# Patient Record
Sex: Male | Born: 1955 | Race: White | Hispanic: No | Smoking: Former smoker
Health system: Southern US, Community
[De-identification: ages and names within clinical notes are randomized; demographics above are authoritative.]

## PROBLEM LIST (undated history)

## (undated) DIAGNOSIS — Z8619 Personal history of other infectious and parasitic diseases: Secondary | ICD-10-CM

## (undated) DIAGNOSIS — Z8546 Personal history of malignant neoplasm of prostate: Secondary | ICD-10-CM

## (undated) DIAGNOSIS — E039 Hypothyroidism, unspecified: Secondary | ICD-10-CM

## (undated) DIAGNOSIS — C801 Malignant (primary) neoplasm, unspecified: Secondary | ICD-10-CM

## (undated) HISTORY — DX: Personal history of other infectious and parasitic diseases: Z86.19

## (undated) HISTORY — DX: Hypothyroidism, unspecified: E03.9

## (undated) HISTORY — DX: Personal history of malignant neoplasm of prostate: Z85.46

---

## 1999-03-08 HISTORY — PX: KNEE ARTHROSCOPY W/ ACL RECONSTRUCTION: SHX1858

## 2002-03-07 DIAGNOSIS — Z8546 Personal history of malignant neoplasm of prostate: Secondary | ICD-10-CM

## 2002-03-07 HISTORY — PX: PROSTATECTOMY: SHX69

## 2002-03-07 HISTORY — DX: Personal history of malignant neoplasm of prostate: Z85.46

## 2008-07-08 ENCOUNTER — Ambulatory Visit: Payer: Self-pay | Admitting: Gastroenterology

## 2010-03-07 DIAGNOSIS — E039 Hypothyroidism, unspecified: Secondary | ICD-10-CM

## 2010-03-07 HISTORY — DX: Hypothyroidism, unspecified: E03.9

## 2011-03-10 ENCOUNTER — Ambulatory Visit (INDEPENDENT_AMBULATORY_CARE_PROVIDER_SITE_OTHER): Payer: BC Managed Care – PPO | Admitting: Family Medicine

## 2011-03-10 ENCOUNTER — Encounter: Payer: Self-pay | Admitting: Family Medicine

## 2011-03-10 VITALS — BP 128/66 | HR 68 | Temp 98.4°F | Ht 74.0 in | Wt 250.0 lb

## 2011-03-10 DIAGNOSIS — E039 Hypothyroidism, unspecified: Secondary | ICD-10-CM

## 2011-03-10 DIAGNOSIS — R21 Rash and other nonspecific skin eruption: Secondary | ICD-10-CM

## 2011-03-10 DIAGNOSIS — Z8546 Personal history of malignant neoplasm of prostate: Secondary | ICD-10-CM | POA: Insufficient documentation

## 2011-03-10 DIAGNOSIS — K429 Umbilical hernia without obstruction or gangrene: Secondary | ICD-10-CM | POA: Insufficient documentation

## 2011-03-10 LAB — POCT SKIN KOH: Skin KOH, POC: NEGATIVE

## 2011-03-10 MED ORDER — DOXYCYCLINE HYCLATE 100 MG PO CAPS: 100.0000 mg | ORAL_CAPSULE | Freq: Two times a day (BID) | ORAL | Status: AC

## 2011-03-10 NOTE — Assessment & Plan Note (Signed)
Pt would like surgery eval deferred today. Will reassess when returns.

## 2011-03-10 NOTE — Assessment & Plan Note (Addendum)
KOH today negative. Anticipate component of xerosis, but concern for infection in addition so will treat with doxycycline.  Also recommended moisturizing of leg throughout the day. Superimposed pruritis, consider hydralazine if not improving. RTC 1 mo for f/u.

## 2011-03-10 NOTE — Progress Notes (Signed)
Subjective:    Patient ID: Philip Lindsey, male    DOB: 11-Mar-1955, 56 y.o.   MRN: [id number]  HPI CC: new pt establish  Prior saw Dr. Glenis Smoker.  Would like to establish as closer.  I will request records from Dr. Glenis Smoker - pt states handed in ROI up front  Would like leg checked.  Started with boil on left lateral side, has turned into more boils.  Started having boils on leg 11/2010, after mosquito bites.  Very itchy only at night time.  Has been present since October.  Has been self treating with alcohol and hydrogen peroxide.   No new lotions, detergents, soaps, shampoos, foods.  H/o prostate cancer s/p radical prostatectomy, PSA 0 per pt last checked 12/2010.  Has umbilical hernia.  Has not seen surgery about this.  Preventative: Had CPE 11/2010.  Had [place] of aorta negative for aneurysms and blood work colonoscopy - WNL 2010, rpt 10 yrs, will await records. Prostate - s/p cancer, radical prostatectomy 2004.  PSA with prior PCP. Flu shot - declines.  "i get sick" Tetanus - unsure when, will await records.  Caffeine: 1 coffee/day, 2-3 tea/soda/day Lives with fiancee, no pets.  2 grown children, one local Occupation: Investment banker, corporate Edu: [employer]yrs college Activity: bowling Diet: fair - little water, fruits/vegetables daily, red meat 2-3x/wk, fish 1-2x/wk  Medications and allergies reviewed and updated in chart.  Past histories reviewed and updated if relevant as below. There is no problem list on file for this patient.  Past Medical History  Diagnosis Date  . Prostate cancer 2004    prostatectomy 2004  . History of chicken pox   . Hypothyroid 2012    on synthroid   Past Surgical History  Procedure Date  . Knee arthroscopy w/ acl reconstruction 2001  . Prostatectomy 2004   History  Substance Use Topics  . Smoking status: Former Smoker -- 0.5 packs/day for 10 years    Quit date: 03/07/2005  . Smokeless tobacco: Never Used  . Alcohol Use: No   Family History  Problem  Relation Age of Onset  . Diabetes Mother   . Coronary artery disease Neg Hx   . Stroke Neg Hx   . Hypertension Neg Hx   . Hyperlipidemia Neg Hx   . Cancer Neg Hx    No Known Allergies No current outpatient prescriptions on file prior to visit.   Review of Systems  Constitutional: Negative for fever, chills, activity change, appetite change, fatigue and unexpected weight change.  HENT: Negative for hearing loss and neck pain.   Eyes: Negative for visual disturbance.  Respiratory: Negative for cough, chest tightness, shortness of breath and wheezing.   Cardiovascular: Negative for chest pain, palpitations and leg swelling.  Gastrointestinal: Negative for nausea, vomiting, abdominal pain, diarrhea, constipation, blood in stool and abdominal distention.  Genitourinary: Negative for hematuria and difficulty urinating.  Musculoskeletal: Negative for myalgias and arthralgias.  Skin: Negative for rash.  Neurological: Negative for dizziness, seizures, syncope and headaches.  Hematological: Does not bruise/bleed easily.  Psychiatric/Behavioral: Negative for dysphoric mood. The patient is not nervous/anxious.        Objective:   Physical Exam  Nursing note and vitals reviewed. Constitutional: She is oriented to person, place, and time. She appears well-developed and well-nourished. No distress.  HENT:  Head: Normocephalic and atraumatic.  Right Ear: Hearing, tympanic membrane, external ear and ear canal normal.  Left Ear: Hearing, tympanic membrane, external ear and ear canal normal.  Nose: Nose normal. No  mucosal edema. Right sinus exhibits no maxillary sinus tenderness and no frontal sinus tenderness. Left sinus exhibits no maxillary sinus tenderness and no frontal sinus tenderness.  Mouth/Throat: Uvula is midline, oropharynx is clear and moist and mucous membranes are normal. No oropharyngeal exudate, posterior oropharyngeal edema, posterior oropharyngeal erythema or tonsillar abscesses.    Eyes: Conjunctivae and EOM are normal. Pupils are equal, round, and reactive to light. No scleral icterus.       Xanthelasma of lower eye lids  Neck: Normal range of motion. Neck supple. No thyromegaly present.  Cardiovascular: Normal rate, regular rhythm, normal heart sounds and intact distal pulses.   No murmur heard. Pulses:      Radial pulses are 2+ on the right side, and 2+ on the left side.  Pulmonary/Chest: Effort normal and breath sounds normal. No respiratory distress. She has no wheezes. She has no rales.  Abdominal: Soft. Bowel sounds are normal. She exhibits no distension and no mass. There is no tenderness. There is no rebound and no guarding. A hernia (~2.5cm umbilical hernia, reducible) is present.  Musculoskeletal: Normal range of motion.  Lymphadenopathy:    She has no cervical adenopathy.  Neurological: She is alert and oriented to person, place, and time.       CN grossly intact, station and gait intact  Skin: Skin is warm and dry. Rash noted.       Bilateral legs L>R with erythematous scaly rash, left lateral leg with scab surrounded by erythema, + dry skin, superior and posterior with newer small 1cm boil Dry skin external ears, scaling of scalp  Psychiatric: She has a normal mood and affect. Her behavior is normal. Judgment and thought content normal.     Assessment & Plan:

## 2011-03-10 NOTE — Patient Instructions (Addendum)
For legs - moisturizing cream like aveeno or eucerin brands.  Doxycycline twice daily for 10 days to cover any infection component.  Return in 1 month for follow up. We will call you if scrapings change plan. I will await records from Dr. Glenis Smoker. Good to meet you today.  Call us with questions.

## 2011-03-10 NOTE — Assessment & Plan Note (Signed)
Await records from prior PCP. 

## 2011-03-10 NOTE — Assessment & Plan Note (Signed)
needs yearly PSA to monitor.

## 2011-04-11 ENCOUNTER — Ambulatory Visit (INDEPENDENT_AMBULATORY_CARE_PROVIDER_SITE_OTHER): Payer: BC Managed Care – PPO | Admitting: Family Medicine

## 2011-04-11 ENCOUNTER — Encounter: Payer: Self-pay | Admitting: Family Medicine

## 2011-04-11 VITALS — BP 136/74 | HR 72 | Temp 99.1°F | Wt 252.0 lb

## 2011-04-11 DIAGNOSIS — R21 Rash and other nonspecific skin eruption: Secondary | ICD-10-CM

## 2011-04-11 MED ORDER — TRIAMCINOLONE ACETONIDE 0.1 % EX CREA: TOPICAL_CREAM | Freq: Two times a day (BID) | CUTANEOUS | Status: AC

## 2011-04-11 NOTE — Assessment & Plan Note (Addendum)
Dry skin rash - anticipate xerosis.  ddx includes psoriatic vs fungal. Last visit KOH negative. Stop rubbing EtOH, rec start TCI cream as well as moisturizer.  Discussed if worsening on steroid, stop and notify me. Did improve after doxy, no evidence of continued infection. rec take benadryl nightly for 1 wk to help decrease scratching at night.

## 2011-04-11 NOTE — Patient Instructions (Addendum)
I think you have component of dry skin dermatitis or xerosis. We will start you on mild steroid cream to use for legs.  Use this then use aveeno moisturizer. Stop alcohol rub. Let me know if not improving.  Let me know if itching getting worse. Benadryl at night for itching. We will call Dr. Martie Round office to check on records.

## 2011-04-11 NOTE — Progress Notes (Signed)
  Subjective:    Patient ID: Philip Lindsey, male    DOB: 1955/05/27, 56 y.o.   MRN: [id number]  HPI CC: f/u rash  Seen here 03/2011 as new pt, had rash on legs, boils.  Prior visit: KOH negative.  Anticipated component of xerosis, but concern for infection in addition so treated with doxycycline.  Also recommended moisturizing of leg throughout the day.  rec rtc 1 mo.  Overall better.  Still itching only at night.  Pruritis not bothering him that much.  Review of Systems Per HPI    Objective:   Physical Exam  Nursing note and vitals reviewed. Constitutional: He appears well-developed and well-nourished. No distress.  Musculoskeletal:       Legs: Skin: Skin is warm and dry. Rash noted. No erythema.       Bilateral lower legs L>R with several 2-3 cm erythematous scaly annular rash, old scab healing.   + excoriations throughout lower legs       Assessment & Plan:

## 2014-05-06 HISTORY — PX: COLONOSCOPY: SHX174

## 2014-05-12 LAB — HM COLONOSCOPY: HM COLON: NORMAL

## 2014-06-20 ENCOUNTER — Encounter: Payer: Self-pay | Admitting: Family Medicine

## 2015-04-13 ENCOUNTER — Emergency Department: Payer: BLUE CROSS/BLUE SHIELD

## 2015-04-13 ENCOUNTER — Inpatient Hospital Stay
Admission: EM | Admit: 2015-04-13 | Discharge: 2015-04-22 | DRG: 208 | Disposition: A | Discharge status: Other Acute Inpatient Hospital | Payer: BLUE CROSS/BLUE SHIELD | Attending: Internal Medicine | Admitting: Internal Medicine

## 2015-04-13 ENCOUNTER — Encounter: Payer: Self-pay | Admitting: Emergency Medicine

## 2015-04-13 DIAGNOSIS — T8579XA Infection and inflammatory reaction due to other internal prosthetic devices, implants and grafts, initial encounter: Secondary | ICD-10-CM

## 2015-04-13 DIAGNOSIS — Z8546 Personal history of malignant neoplasm of prostate: Secondary | ICD-10-CM

## 2015-04-13 DIAGNOSIS — E860 Dehydration: Secondary | ICD-10-CM | POA: Diagnosis present

## 2015-04-13 DIAGNOSIS — I509 Heart failure, unspecified: Secondary | ICD-10-CM

## 2015-04-13 DIAGNOSIS — T380X5A Adverse effect of glucocorticoids and synthetic analogues, initial encounter: Secondary | ICD-10-CM | POA: Diagnosis not present

## 2015-04-13 DIAGNOSIS — R05 Cough: Secondary | ICD-10-CM

## 2015-04-13 DIAGNOSIS — E876 Hypokalemia: Secondary | ICD-10-CM | POA: Diagnosis present

## 2015-04-13 DIAGNOSIS — J9601 Acute respiratory failure with hypoxia: Secondary | ICD-10-CM | POA: Diagnosis present

## 2015-04-13 DIAGNOSIS — J849 Interstitial pulmonary disease, unspecified: Secondary | ICD-10-CM | POA: Diagnosis present

## 2015-04-13 DIAGNOSIS — E039 Hypothyroidism, unspecified: Secondary | ICD-10-CM | POA: Diagnosis present

## 2015-04-13 DIAGNOSIS — I248 Other forms of acute ischemic heart disease: Secondary | ICD-10-CM | POA: Diagnosis present

## 2015-04-13 DIAGNOSIS — R739 Hyperglycemia, unspecified: Secondary | ICD-10-CM | POA: Diagnosis not present

## 2015-04-13 DIAGNOSIS — Z01818 Encounter for other preprocedural examination: Secondary | ICD-10-CM

## 2015-04-13 DIAGNOSIS — R509 Fever, unspecified: Secondary | ICD-10-CM

## 2015-04-13 DIAGNOSIS — A419 Sepsis, unspecified organism: Secondary | ICD-10-CM | POA: Diagnosis present

## 2015-04-13 DIAGNOSIS — R0600 Dyspnea, unspecified: Secondary | ICD-10-CM

## 2015-04-13 DIAGNOSIS — R059 Cough, unspecified: Secondary | ICD-10-CM

## 2015-04-13 DIAGNOSIS — K5900 Constipation, unspecified: Secondary | ICD-10-CM

## 2015-04-13 DIAGNOSIS — J9690 Respiratory failure, unspecified, unspecified whether with hypoxia or hypercapnia: Secondary | ICD-10-CM

## 2015-04-13 DIAGNOSIS — J189 Pneumonia, unspecified organism: Principal | ICD-10-CM

## 2015-04-13 DIAGNOSIS — Z452 Encounter for adjustment and management of vascular access device: Secondary | ICD-10-CM

## 2015-04-13 DIAGNOSIS — Z87891 Personal history of nicotine dependence: Secondary | ICD-10-CM

## 2015-04-13 DIAGNOSIS — Y92230 Patient room in hospital as the place of occurrence of the external cause: Secondary | ICD-10-CM | POA: Diagnosis not present

## 2015-04-13 DIAGNOSIS — Z833 Family history of diabetes mellitus: Secondary | ICD-10-CM

## 2015-04-13 HISTORY — DX: Malignant (primary) neoplasm, unspecified: C80.1

## 2015-04-13 LAB — CBC WITH DIFFERENTIAL/PLATELET
BASOS ABS: 0.1 10*3/uL (ref 0–0.1)
Basophils Relative: 1 %
Eosinophils Absolute: 0 10*3/uL (ref 0–0.7)
Eosinophils Relative: 0 %
HEMATOCRIT: 40.3 % (ref 40.0–52.0)
HEMOGLOBIN: 13.6 g/dL (ref 13.0–18.0)
LYMPHS ABS: 0.4 10*3/uL — AB (ref 1.0–3.6)
LYMPHS PCT: 4 %
MCH: 28.1 pg (ref 26.0–34.0)
MCHC: 33.7 g/dL (ref 32.0–36.0)
MCV: 83.3 fL (ref 80.0–100.0)
Monocytes Absolute: 1 10*3/uL (ref 0.2–1.0)
Monocytes Relative: 11 %
NEUTROS ABS: 7.9 10*3/uL — AB (ref 1.4–6.5)
NEUTROS PCT: 84 %
Platelets: 217 10*3/uL (ref 150–440)
RBC: 4.84 MIL/uL (ref 4.40–5.90)
RDW: 13.8 % (ref 11.5–14.5)
WBC: 9.4 10*3/uL (ref 3.8–10.6)

## 2015-04-13 LAB — PROTIME-INR
INR: 1.14
PROTHROMBIN TIME: 14.8 s (ref 11.4–15.0)

## 2015-04-13 LAB — COMPREHENSIVE METABOLIC PANEL
ALBUMIN: 4 g/dL (ref 3.5–5.0)
ALK PHOS: 85 U/L (ref 38–126)
ALT: 49 U/L (ref 17–63)
ANION GAP: 8 (ref 5–15)
AST: 50 U/L — ABNORMAL HIGH (ref 15–41)
BUN: 14 mg/dL (ref 6–20)
CALCIUM: 8.5 mg/dL — AB (ref 8.9–10.3)
CHLORIDE: 102 mmol/L (ref 101–111)
CO2: 27 mmol/L (ref 22–32)
Creatinine, Ser: 0.88 mg/dL (ref 0.61–1.24)
GFR calc Af Amer: >60 mL/min (ref >60)
GFR calc non Af Amer: >60 mL/min (ref >60)
GLUCOSE: 206 mg/dL — AB (ref 65–99)
POTASSIUM: 3.9 mmol/L (ref 3.5–5.1)
SODIUM: 137 mmol/L (ref 135–145)
Total Bilirubin: 0.7 mg/dL (ref 0.3–1.2)
Total Protein: 7.2 g/dL (ref 6.5–8.1)

## 2015-04-13 LAB — RAPID INFLUENZA A&B ANTIGENS (ARMC ONLY)
INFLUENZA A (ARMC): NOT DETECTED
INFLUENZA B (ARMC): NOT DETECTED

## 2015-04-13 LAB — TROPONIN I: TROPONIN I: 0.04 ng/mL — AB (ref <0.031)

## 2015-04-13 LAB — LACTIC ACID, PLASMA: LACTIC ACID, VENOUS: 2 mmol/L (ref 0.5–2.0)

## 2015-04-13 LAB — BRAIN NATRIURETIC PEPTIDE: B Natriuretic Peptide: 25 pg/mL (ref 0.0–100.0)

## 2015-04-13 LAB — APTT: APTT: 30 s (ref 24–36)

## 2015-04-13 MED ORDER — SODIUM CHLORIDE 0.9 % IV BOLUS (SEPSIS)
1000.0000 mL | Freq: Once | INTRAVENOUS | Status: AC
  Administered 2015-04-13: 1000 mL via INTRAVENOUS

## 2015-04-13 MED ORDER — PIPERACILLIN-TAZOBACTAM 3.375 G IVPB
3.3750 g | Freq: Once | INTRAVENOUS | Status: DC
  Filled 2015-04-13 (×2): qty 50

## 2015-04-13 MED ORDER — SODIUM CHLORIDE 0.9 % IV BOLUS (SEPSIS)
2390.0000 mL | Freq: Once | INTRAVENOUS | Status: AC
  Administered 2015-04-14: 2390 mL via INTRAVENOUS

## 2015-04-13 MED ORDER — ASPIRIN 81 MG PO CHEW
324.0000 mg | CHEWABLE_TABLET | Freq: Once | ORAL | Status: AC
  Administered 2015-04-14: 324 mg via ORAL
  Filled 2015-04-13: qty 4

## 2015-04-13 MED ORDER — VANCOMYCIN HCL IN DEXTROSE 1-5 GM/200ML-% IV SOLN
1000.0000 mg | Freq: Once | INTRAVENOUS | Status: AC
  Administered 2015-04-13: 1000 mg via INTRAVENOUS
  Filled 2015-04-13: qty 200

## 2015-04-13 MED ORDER — ACETAMINOPHEN 500 MG PO TABS
1000.0000 mg | ORAL_TABLET | Freq: Once | ORAL | Status: AC
  Administered 2015-04-13: 1000 mg via ORAL
  Filled 2015-04-13: qty 2

## 2015-04-13 MED ORDER — DEXTROSE 5 % IV SOLN: 500.0000 mg | Freq: Once | INTRAVENOUS | Status: DC

## 2015-04-13 NOTE — ED Notes (Signed)
Pt ambulatory to triage c/o cough (producing brown mucus) and fever, SOB worse with exertion.  Pt got "Z-pack" from walkin clinic in Dec and felt some relief but s/sx returned worse and for the last month.    Pt denies V/D/dizziness or pain.  Denies hx of medical conditions.

## 2015-04-13 NOTE — ED Provider Notes (Signed)
[facility] Emergency Department Provider Note  ____________________________________________  Time seen: Approximately 10:45 PM  I have reviewed the triage vital signs and the nursing notes.   HISTORY  Chief Complaint Cough and Fever    HPI EVAGELOS CERNEY is a 60 y.o. male with history of thyroid disease, remote history of prostate cancer who presents for evaluation of 3 days worsening productive cough and fever as well as shortness of breath, gradual onset, constant since onset, currently severe, no modifying factors. He does have chest pain when he coughs. No abdominal pain, vomiting or diarrhea. No dysuria.   Past Medical History  Diagnosis Date  . History of prostate cancer 2004    prostatectomy 2004  . History of chicken pox   . Hypothyroid 2012    on synthroid    Patient Active Problem List   Diagnosis Date Noted  . Umbilical hernia [id number]  . Skin rash 03/10/2011  . History of prostate cancer   . Hypothyroid     Past Surgical History  Procedure Laterality Date  . Knee arthroscopy w/ acl reconstruction  2001  . Prostatectomy  2004  . Colonoscopy  05/2014    normal, rpt 43yrs due to fam hx Oletta Lamas)    Current Outpatient Rx  Name  Route  Sig  Dispense  Refill  . levothyroxine (SYNTHROID, LEVOTHROID) 50 MCG tablet   Oral   Take 50 mcg by mouth daily. Reported on 04/13/2015           Allergies Review of patient's allergies indicates no known allergies.  Family History  Problem Relation Age of Onset  . Diabetes Mother   . Coronary artery disease Neg Hx   . Stroke Neg Hx   . Hypertension Neg Hx   . Hyperlipidemia Neg Hx   . Cancer Neg Hx     Social History Social History  Substance Use Topics  . Smoking status: Former Smoker -- 0.50 packs/day for 10 years    Quit date: 03/07/2005  . Smokeless tobacco: Never Used  . Alcohol Use: No    Review of Systems Constitutional: + fever/chills Eyes: No visual changes. ENT: No  sore throat. Cardiovascular: + chest pain. Respiratory: +shortness of breath. Gastrointestinal: No abdominal pain.  No nausea, no vomiting.  No diarrhea.  No constipation. Genitourinary: Negative for dysuria. Musculoskeletal: Negative for back pain. Skin: Negative for rash. Neurological: Negative for headaches, focal weakness or numbness.  10-point ROS otherwise negative.  ____________________________________________   PHYSICAL EXAM:  VITAL SIGNS: ED Triage Vitals  Enc Vitals Group     BP 04/13/15 2207 160/64 mmHg     Pulse Rate 04/13/15 2207 128     Resp 04/13/15 2207 18     Temp 04/13/15 2207 103.1 F (39.5 C)     Temp Source 04/13/15 2207 Oral     SpO2 04/13/15 2207 94 %     Weight 04/13/15 2207 250 lb (113.399 kg)     Height 04/13/15 2207 6\' 2"  (1.88 m)     Head Cir --      Peak Flow --      Pain Score --      Pain Loc --      Pain Edu? --      Excl. in [place]? --     Constitutional: Alert and oriented. In no acute distress. Eyes: Conjunctivae are normal. PERRL. EOMI. Head: Atraumatic. Nose: + congestion/rhinnorhea. Mouth/Throat: Mucous membranes are moist.  Oropharynx non-erythematous. Neck: No stridor. Supple without meningismus.  Cardiovascular: Tachycardic rate, regular rhythm. Grossly normal heart sounds.  Good peripheral circulation. Respiratory: Moderate tachypnea. Diminished breath sounds throughout. Gastrointestinal: Soft and nontender. No distention.  No CVA tenderness. Genitourinary: deferred Musculoskeletal: No lower extremity tenderness nor edema.  No joint effusions. Neurologic:  Normal speech and language. No gross focal neurologic deficits are appreciated. No gait instability. Skin:  Skin is warm, dry and intact. No rash noted. Psychiatric: Mood and affect are normal. Speech and behavior are normal.  ____________________________________________   LABS (all labs ordered are listed, but only abnormal results are displayed)  Labs Reviewed   COMPREHENSIVE METABOLIC PANEL - Abnormal; Notable for the following:    Glucose, Bld 206 (*)    Calcium 8.5 (*)    AST 50 (*)    All other components within normal limits  TROPONIN I - Abnormal; Notable for the following:    Troponin I 0.04 (*)    All other components within normal limits  CBC WITH DIFFERENTIAL/PLATELET - Abnormal; Notable for the following:    Neutro Abs 7.9 (*)    Lymphs Abs 0.4 (*)    All other components within normal limits  RAPID INFLUENZA A&B ANTIGENS ([facility] ONLY)  CULTURE, BLOOD (ROUTINE X 2)  CULTURE, BLOOD (ROUTINE X 2)  URINE CULTURE  LACTIC ACID, PLASMA  BRAIN NATRIURETIC PEPTIDE  APTT  PROTIME-INR  LACTIC ACID, PLASMA  URINALYSIS COMPLETEWITH MICROSCOPIC ([facility] ONLY)  TYPE AND SCREEN  ABO/RH   ____________________________________________  EKG  ED ECG REPORT I, Joanne Gavel, the attending physician, personally viewed and interpreted this ECG.   Date: 04/13/2015  EKG Time: 22:45  Rate: 122  Rhythm: sinus tachycardia  Axis: normal  Intervals: QTc mildly prolonged at 518 ms.  ST&T Change: No acute ST elevation. Minimal ST depression in V4, V5.  ____________________________________________  RADIOLOGY  CXR IMPRESSION: Vascular congestion noted. Mildly increased lung markings may reflect atelectasis or possibly mild infection, given the patient's symptoms. ____________________________________________   PROCEDURES  Procedure(s) performed: None  Critical Care performed: Yes, see critical care note(s). Total critical care time spent 30 minutes.  ____________________________________________   INITIAL IMPRESSION / ASSESSMENT AND PLAN / ED COURSE  Pertinent labs & imaging results that were available during my care of the patient were reviewed by me and considered in my medical decision making (see chart for details).  TREVINO SHAKUR is a 60 y.o. male with history of thyroid disease, remote history of prostate cancer who presents for  evaluation of 3 days worsening productive cough and fever as well as shortness of breath. On arrival to the emergency department he is meeting 3 out of 4 Sirs criteria for fever, tachycardia and tachypnea. He has diminished breath sounds throughout all lung fields. We'll give IV fluids, IV vancomycin and Zosyn, obtain chest x-ray, urinalysis, screening labs and anticipate admission.  ----------------------------------------- 11:54 PM on 04/13/2015 ----------------------------------------- Chest x-ray concerning for infection. Suspect sepsis secondary to pneumonia. Azithromycin added for atypical coverage for community acquired pneumonia. Flu negative. CBC and CMP generally unremarkable. Troponin mildly elevated 0.04, suspect demand ischemia in the setting of tachycardia which is improving with the above treatments. Aspirin ordered. Case discussed with hospitalist, Dr. Orvilla Cornwall, for admission.  ____________________________________________   FINAL CLINICAL IMPRESSION(S) / ED DIAGNOSES  Final diagnoses:  Fever, unspecified fever cause  Cough  Sepsis, due to unspecified organism [facility])  Community acquired pneumonia      Joanne Gavel, MD 04/14/15 [phone]

## 2015-04-13 NOTE — ED Notes (Signed)
Patient transported to X-ray via stretcher 

## 2015-04-14 ENCOUNTER — Encounter: Payer: Self-pay | Admitting: Internal Medicine

## 2015-04-14 ENCOUNTER — Inpatient Hospital Stay
Admit: 2015-04-14 | Discharge: 2015-04-14 | Disposition: A | Discharge status: Home / Self Care | Payer: BLUE CROSS/BLUE SHIELD | Attending: Internal Medicine | Admitting: Internal Medicine

## 2015-04-14 DIAGNOSIS — Y92230 Patient room in hospital as the place of occurrence of the external cause: Secondary | ICD-10-CM | POA: Diagnosis not present

## 2015-04-14 DIAGNOSIS — J9621 Acute and chronic respiratory failure with hypoxia: Secondary | ICD-10-CM | POA: Diagnosis not present

## 2015-04-14 DIAGNOSIS — E876 Hypokalemia: Secondary | ICD-10-CM | POA: Diagnosis present

## 2015-04-14 DIAGNOSIS — J849 Interstitial pulmonary disease, unspecified: Secondary | ICD-10-CM | POA: Diagnosis present

## 2015-04-14 DIAGNOSIS — E860 Dehydration: Secondary | ICD-10-CM | POA: Diagnosis present

## 2015-04-14 DIAGNOSIS — R739 Hyperglycemia, unspecified: Secondary | ICD-10-CM | POA: Diagnosis not present

## 2015-04-14 DIAGNOSIS — A419 Sepsis, unspecified organism: Secondary | ICD-10-CM | POA: Diagnosis present

## 2015-04-14 DIAGNOSIS — I248 Other forms of acute ischemic heart disease: Secondary | ICD-10-CM | POA: Diagnosis present

## 2015-04-14 DIAGNOSIS — J189 Pneumonia, unspecified organism: Secondary | ICD-10-CM | POA: Diagnosis not present

## 2015-04-14 DIAGNOSIS — Z833 Family history of diabetes mellitus: Secondary | ICD-10-CM | POA: Diagnosis not present

## 2015-04-14 DIAGNOSIS — Z87891 Personal history of nicotine dependence: Secondary | ICD-10-CM | POA: Diagnosis not present

## 2015-04-14 DIAGNOSIS — T380X5A Adverse effect of glucocorticoids and synthetic analogues, initial encounter: Secondary | ICD-10-CM | POA: Diagnosis not present

## 2015-04-14 DIAGNOSIS — E039 Hypothyroidism, unspecified: Secondary | ICD-10-CM | POA: Diagnosis present

## 2015-04-14 DIAGNOSIS — J9601 Acute respiratory failure with hypoxia: Secondary | ICD-10-CM | POA: Diagnosis not present

## 2015-04-14 DIAGNOSIS — Z8546 Personal history of malignant neoplasm of prostate: Secondary | ICD-10-CM | POA: Diagnosis not present

## 2015-04-14 LAB — URINALYSIS COMPLETE WITH MICROSCOPIC (ARMC ONLY)
BILIRUBIN URINE: NEGATIVE
Bacteria, UA: NONE SEEN
GLUCOSE, UA: NEGATIVE mg/dL
HGB URINE DIPSTICK: NEGATIVE
LEUKOCYTES UA: NEGATIVE
NITRITE: NEGATIVE
Protein, ur: 30 mg/dL — AB
SPECIFIC GRAVITY, URINE: 1.027 (ref 1.005–1.030)
pH: 6 (ref 5.0–8.0)

## 2015-04-14 LAB — COMPREHENSIVE METABOLIC PANEL
ALT: 35 U/L (ref 17–63)
ANION GAP: 4 — AB (ref 5–15)
AST: 41 U/L (ref 15–41)
Albumin: 2.9 g/dL — ABNORMAL LOW (ref 3.5–5.0)
Alkaline Phosphatase: 58 U/L (ref 38–126)
BUN: 11 mg/dL (ref 6–20)
CHLORIDE: 114 mmol/L — AB (ref 101–111)
CO2: 25 mmol/L (ref 22–32)
CREATININE: 0.8 mg/dL (ref 0.61–1.24)
Calcium: 6.4 mg/dL — CL (ref 8.9–10.3)
Glucose, Bld: 140 mg/dL — ABNORMAL HIGH (ref 65–99)
Potassium: 3.2 mmol/L — ABNORMAL LOW (ref 3.5–5.1)
SODIUM: 143 mmol/L (ref 135–145)
Total Bilirubin: 0.5 mg/dL (ref 0.3–1.2)
Total Protein: 5.3 g/dL — ABNORMAL LOW (ref 6.5–8.1)

## 2015-04-14 LAB — CBC WITH DIFFERENTIAL/PLATELET
BASOS ABS: 0 10*3/uL (ref 0–0.1)
BASOS PCT: 0 %
Eosinophils Absolute: 0.1 10*3/uL (ref 0–0.7)
Eosinophils Relative: 1 %
HEMATOCRIT: 33.5 % — AB (ref 40.0–52.0)
HEMOGLOBIN: 11.1 g/dL — AB (ref 13.0–18.0)
LYMPHS PCT: 10 %
Lymphs Abs: 0.5 10*3/uL — ABNORMAL LOW (ref 1.0–3.6)
MCH: 28.5 pg (ref 26.0–34.0)
MCHC: 33.1 g/dL (ref 32.0–36.0)
MCV: 86.1 fL (ref 80.0–100.0)
Monocytes Absolute: 0.7 10*3/uL (ref 0.2–1.0)
Monocytes Relative: 13 %
NEUTROS ABS: 4 10*3/uL (ref 1.4–6.5)
NEUTROS PCT: 76 %
PLATELETS: 176 10*3/uL (ref 150–440)
RBC: 3.89 MIL/uL — ABNORMAL LOW (ref 4.40–5.90)
RDW: 14 % (ref 11.5–14.5)
WBC: 5.2 10*3/uL (ref 3.8–10.6)

## 2015-04-14 LAB — PROCALCITONIN: PROCALCITONIN: 0.21 ng/mL

## 2015-04-14 LAB — PROTIME-INR
INR: 1.4
PROTHROMBIN TIME: 17.3 s — AB (ref 11.4–15.0)

## 2015-04-14 LAB — PHOSPHORUS: Phosphorus: 2.9 mg/dL (ref 2.5–4.6)

## 2015-04-14 LAB — TROPONIN I
TROPONIN I: 0.03 ng/mL (ref <0.031)
Troponin I: 0.04 ng/mL — ABNORMAL HIGH (ref <0.031)
Troponin I: 0.07 ng/mL — ABNORMAL HIGH (ref <0.031)
Troponin I: 0.05 ng/mL — ABNORMAL HIGH (ref <0.031)

## 2015-04-14 LAB — TYPE AND SCREEN
ABO/RH(D): O POS
Antibody Screen: NEGATIVE

## 2015-04-14 LAB — STREP PNEUMONIAE URINARY ANTIGEN: Strep Pneumo Urinary Antigen: NEGATIVE

## 2015-04-14 LAB — MAGNESIUM: Magnesium: 1.5 mg/dL — ABNORMAL LOW (ref 1.7–2.4)

## 2015-04-14 LAB — LACTIC ACID, PLASMA: LACTIC ACID, VENOUS: 1.9 mmol/L (ref 0.5–2.0)

## 2015-04-14 LAB — APTT: APTT: 33 s (ref 24–36)

## 2015-04-14 LAB — ABO/RH: ABO/RH(D): O POS

## 2015-04-14 MED ORDER — ENOXAPARIN SODIUM 40 MG/0.4ML ~~LOC~~ SOLN
40.0000 mg | SUBCUTANEOUS | Status: DC
  Administered 2015-04-14 – 2015-04-15 (×2): 40 mg via SUBCUTANEOUS
  Filled 2015-04-14 (×2): qty 0.4

## 2015-04-14 MED ORDER — IBUPROFEN 400 MG PO TABS
400.0000 mg | ORAL_TABLET | Freq: Four times a day (QID) | ORAL | Status: DC | PRN
  Administered 2015-04-14 – 2015-04-16 (×4): 400 mg via ORAL
  Filled 2015-04-14 (×4): qty 1

## 2015-04-14 MED ORDER — SODIUM CHLORIDE 0.9 % IV SOLN
INTRAVENOUS | Status: DC
  Administered 2015-04-14 – 2015-04-16 (×4): via INTRAVENOUS

## 2015-04-14 MED ORDER — SODIUM CHLORIDE 0.9 % IV BOLUS (SEPSIS): 500.0000 mL | INTRAVENOUS | Status: DC

## 2015-04-14 MED ORDER — PIPERACILLIN-TAZOBACTAM 3.375 G IVPB 30 MIN
3.3750 g | INTRAVENOUS | Status: AC
  Administered 2015-04-14: 3.375 g via INTRAVENOUS

## 2015-04-14 MED ORDER — DEXTROSE 5 % IV SOLN
500.0000 mg | Freq: Once | INTRAVENOUS | Status: AC
  Administered 2015-04-14: 500 mg via INTRAVENOUS
  Filled 2015-04-14: qty 500

## 2015-04-14 MED ORDER — LEVOTHYROXINE SODIUM 25 MCG PO TABS
50.0000 ug | ORAL_TABLET | Freq: Every day | ORAL | Status: DC
  Administered 2015-04-14 – 2015-04-22 (×9): 50 ug via ORAL
  Filled 2015-04-14: qty 1
  Filled 2015-04-14 (×7): qty 2
  Filled 2015-04-14: qty 1

## 2015-04-14 MED ORDER — ACETAMINOPHEN 650 MG RE SUPP: 650.0000 mg | Freq: Four times a day (QID) | RECTAL | Status: DC | PRN

## 2015-04-14 MED ORDER — ENOXAPARIN SODIUM 40 MG/0.4ML ~~LOC~~ SOLN: 40.0000 mg | SUBCUTANEOUS | Status: DC

## 2015-04-14 MED ORDER — ONDANSETRON HCL 4 MG PO TABS: 4.0000 mg | ORAL_TABLET | Freq: Four times a day (QID) | ORAL | Status: DC | PRN

## 2015-04-14 MED ORDER — SODIUM CHLORIDE 0.9 % IV SOLN
1.0000 g | Freq: Once | INTRAVENOUS | Status: AC
  Administered 2015-04-14: 1 g via INTRAVENOUS
  Filled 2015-04-14 (×2): qty 10

## 2015-04-14 MED ORDER — MAGNESIUM SULFATE 2 GM/50ML IV SOLN
2.0000 g | Freq: Once | INTRAVENOUS | Status: AC
  Administered 2015-04-14: 2 g via INTRAVENOUS
  Filled 2015-04-14: qty 50

## 2015-04-14 MED ORDER — SODIUM CHLORIDE 0.9% FLUSH
3.0000 mL | Freq: Two times a day (BID) | INTRAVENOUS | Status: DC
  Administered 2015-04-15 – 2015-04-19 (×9): 3 mL via INTRAVENOUS
  Administered 2015-04-19: 10 mL via INTRAVENOUS
  Administered 2015-04-20 – 2015-04-22 (×4): 3 mL via INTRAVENOUS

## 2015-04-14 MED ORDER — DEXTROSE 5 % IV SOLN
1.0000 g | INTRAVENOUS | Status: DC
  Administered 2015-04-15: 1 g via INTRAVENOUS
  Filled 2015-04-14 (×2): qty 10

## 2015-04-14 MED ORDER — PANTOPRAZOLE SODIUM 40 MG PO TBEC
40.0000 mg | DELAYED_RELEASE_TABLET | Freq: Every day | ORAL | Status: DC
  Administered 2015-04-14 – 2015-04-15 (×2): 40 mg via ORAL
  Filled 2015-04-14 (×2): qty 1

## 2015-04-14 MED ORDER — HYDROCODONE-ACETAMINOPHEN 5-325 MG PO TABS: 1.0000 | ORAL_TABLET | ORAL | Status: DC | PRN

## 2015-04-14 MED ORDER — SODIUM CHLORIDE 0.9 % IV SOLN
1.0000 g | Freq: Once | INTRAVENOUS | Status: AC
  Administered 2015-04-14: 1 g via INTRAVENOUS
  Filled 2015-04-14: qty 10

## 2015-04-14 MED ORDER — DEXTROSE 5 % IV SOLN
1.0000 g | Freq: Once | INTRAVENOUS | Status: AC
  Administered 2015-04-14: 1 g via INTRAVENOUS
  Filled 2015-04-14: qty 10

## 2015-04-14 MED ORDER — DEXTROSE 5 % IV SOLN
500.0000 mg | INTRAVENOUS | Status: DC
  Administered 2015-04-15 – 2015-04-17 (×3): 500 mg via INTRAVENOUS
  Filled 2015-04-14 (×4): qty 500

## 2015-04-14 MED ORDER — GUAIFENESIN-DM 100-10 MG/5ML PO SYRP
5.0000 mL | ORAL_SOLUTION | ORAL | Status: DC | PRN
  Administered 2015-04-16: 5 mL via ORAL
  Filled 2015-04-14: qty 5

## 2015-04-14 MED ORDER — SODIUM CHLORIDE 0.9 % IV BOLUS (SEPSIS): 1000.0000 mL | INTRAVENOUS | Status: DC

## 2015-04-14 MED ORDER — POTASSIUM CHLORIDE CRYS ER 20 MEQ PO TBCR
40.0000 meq | EXTENDED_RELEASE_TABLET | Freq: Once | ORAL | Status: AC
  Administered 2015-04-14: 40 meq via ORAL
  Filled 2015-04-14: qty 2

## 2015-04-14 MED ORDER — ONDANSETRON HCL 4 MG/2ML IJ SOLN: 4.0000 mg | Freq: Four times a day (QID) | INTRAMUSCULAR | Status: DC | PRN

## 2015-04-14 MED ORDER — SENNOSIDES-DOCUSATE SODIUM 8.6-50 MG PO TABS: 1.0000 | ORAL_TABLET | Freq: Every evening | ORAL | Status: DC | PRN

## 2015-04-14 MED ORDER — CALCIUM GLUCONATE 10 % IV SOLN
2.0000 g | Freq: Once | INTRAVENOUS | Status: DC
  Filled 2015-04-14: qty 20

## 2015-04-14 MED ORDER — PIPERACILLIN-TAZOBACTAM 3.375 G IVPB: 3.3750 g | INTRAVENOUS | Status: DC

## 2015-04-14 MED ORDER — IPRATROPIUM-ALBUTEROL 0.5-2.5 (3) MG/3ML IN SOLN
3.0000 mL | Freq: Four times a day (QID) | RESPIRATORY_TRACT | Status: DC | PRN
  Administered 2015-04-16 (×2): 3 mL via RESPIRATORY_TRACT
  Filled 2015-04-14 (×2): qty 3

## 2015-04-14 MED ORDER — ACETAMINOPHEN 325 MG PO TABS
650.0000 mg | ORAL_TABLET | Freq: Four times a day (QID) | ORAL | Status: DC | PRN
  Administered 2015-04-14 – 2015-04-21 (×10): 650 mg via ORAL
  Filled 2015-04-14 (×10): qty 2

## 2015-04-14 NOTE — Progress Notes (Signed)
Dr. Benjie Karvonen notified of elevated temp and elevated troponin. Temp recheck 101.1. Wilnette Kales

## 2015-04-14 NOTE — Progress Notes (Signed)
*  PRELIMINARY RESULTS* Echocardiogram 2D Echocardiogram has been performed.  Philip Lindsey 04/14/2015, 8:50 PM

## 2015-04-14 NOTE — Progress Notes (Signed)
Ceftriaxone and azithromycin dosing  Pharmacy consulted to dose antibiotics for CAP in this 60 y/o M.  Blood and sputum cx pending CXR: atelectasis vs. Infection  Ceftriaxone 1 gram IV q 24 hours and azithromycin 500 mg IV q 24 hours ordered.  Sim Boast, PharmD, BCPS  04/14/2015

## 2015-04-14 NOTE — Progress Notes (Signed)
Upon vital sign assessment temp elevated. Tylenol given at this time.  Philip Lindsey

## 2015-04-14 NOTE — H&P (Signed)
Philip Lindsey NAME: Philip Lindsey    MR#:  [id number]  DATE OF BIRTH:  1956-01-13  DATE OF ADMISSION:  04/13/2015  PRIMARY CARE PHYSICIAN: Ria Bush, MD   REQUESTING/REFERRING PHYSICIAN:   CHIEF COMPLAINT:   Chief Complaint  Patient presents with  . Cough  . Fever    HISTORY OF PRESENT ILLNESS: Philip Lindsey  is a 60 y.o. male with a known history of prostate cancer, hypothyroidism presented to the emergency room with fever and cough. Patient also had some chills. The cough and fever have been going on for the last 4-5 days. Patient took oral Zithromax antibiotics in December when he saw a doctor in the walk-in clinic. He says the cough did not completely get relieved and for the last 3 days he also had some fever and chills. Cough is productive of yellowish phlegm. No complaints of any chest pain. Difficulty breathing whenever he has cough. Presentation in the ER patient had fever and tachycardia and was septic. Patient does not use any home oxygen. No history of recent travel.No sick contacts at home. No complaints of orthopnea or proximal nocturnal dyspnea.  PAST MEDICAL HISTORY:   Past Medical History  Diagnosis Date  . History of prostate cancer 2004    prostatectomy 2004  . History of chicken pox   . Hypothyroid 2012    on synthroid    PAST SURGICAL HISTORY: Past Surgical History  Procedure Laterality Date  . Knee arthroscopy w/ acl reconstruction  2001  . Prostatectomy  2004  . Colonoscopy  05/2014    normal, rpt 41yrs due to fam hx Oletta Lamas)    SOCIAL HISTORY:  Social History  Substance Use Topics  . Smoking status: Former Smoker -- 0.50 packs/day for 10 years    Quit date: 03/07/2005  . Smokeless tobacco: Never Used  . Alcohol Use: No    FAMILY HISTORY:  Family History  Problem Relation Age of Onset  . Diabetes Mother   . Coronary artery disease Neg Hx   . Stroke Neg Hx   . Hypertension Neg Hx   .  Hyperlipidemia Neg Hx   . Cancer Neg Hx     DRUG ALLERGIES: No Known Allergies  REVIEW OF SYSTEMS:   CONSTITUTIONAL: Has fever, Has weakness.  EYES: No blurred or double vision.  EARS, NOSE, AND THROAT: No tinnitus or ear pain.  RESPIRATORY: Has cough, has shortness of breath, no wheezing or hemoptysis.  CARDIOVASCULAR: No chest pain, orthopnea, edema.  GASTROINTESTINAL: No nausea, vomiting, diarrhea or abdominal pain.  GENITOURINARY: No dysuria, hematuria.  ENDOCRINE: No polyuria, nocturia,  HEMATOLOGY: No anemia, easy bruising or bleeding SKIN: No rash or lesion. MUSCULOSKELETAL: No joint pain or arthritis.   NEUROLOGIC: No tingling, numbness, weakness.  PSYCHIATRY: No anxiety or depression.   MEDICATIONS AT HOME:  Prior to Admission medications   Medication Sig Start Date End Date Taking? Authorizing Provider  levothyroxine (SYNTHROID, LEVOTHROID) 50 MCG tablet Take 50 mcg by mouth daily. Reported on 04/13/2015    Historical Provider, MD      PHYSICAL EXAMINATION:   VITAL SIGNS: Blood pressure 117/57, pulse 115, temperature 99.5 F (37.5 C), temperature source Oral, resp. rate 20, height 6\' 2"  (1.88 m), weight 113.399 kg (250 lb), SpO2 94 %.  GENERAL:  60 y.o.-year-old patient lying in the bed in mild distress.  EYES: Pupils equal, round, reactive to light and accommodation. No scleral icterus. Extraocular muscles intact.  HEENT:  Head atraumatic, normocephalic. Oropharynx and nasopharynx clear.  NECK:  Supple, no jugular venous distention. No thyroid enlargement, no tenderness.  LUNGS: Decreased breath sounds bilaterally, rales in both lungs,no rhonchi or crepitation. No use of accessory muscles of respiration.  CARDIOVASCULAR: S1, S2 has tachycardia. No murmurs, rubs, or gallops.  ABDOMEN: Soft, nontender, nondistended. Bowel sounds present. No organomegaly or mass.  EXTREMITIES: No pedal edema, cyanosis, or clubbing.  NEUROLOGIC: Cranial nerves II through XII are intact.  Muscle strength 5/5 in all extremities. Sensation intact. Gait normal PSYCHIATRIC: The patient is alert and oriented x 3.  SKIN: No obvious rash, lesion, or ulcer.   LABORATORY PANEL:   CBC  Recent Labs Lab 04/13/15 2257  WBC 9.4  HGB 13.6  HCT 40.3  PLT 217  MCV 83.3  MCH 28.1  MCHC 33.7  RDW 13.8  LYMPHSABS 0.4*  MONOABS 1.0  EOSABS 0.0  BASOSABS 0.1   ------------------------------------------------------------------------------------------------------------------  Chemistries   Recent Labs Lab 04/13/15 2257  NA 137  K 3.9  CL 102  CO2 27  GLUCOSE 206*  BUN 14  CREATININE 0.88  CALCIUM 8.5*  AST 50*  ALT 49  ALKPHOS 85  BILITOT 0.7   ------------------------------------------------------------------------------------------------------------------ estimated creatinine clearance is 119.6 mL/min (by C-G formula based on Cr of 0.88). ------------------------------------------------------------------------------------------------------------------ No results for input(s): TSH, T4TOTAL, T3FREE, THYROIDAB in the last 72 hours.  Invalid input(s): FREET3   Coagulation profile  Recent Labs Lab 04/13/15 2257  INR 1.14   ------------------------------------------------------------------------------------------------------------------- No results for input(s): DDIMER in the last 72 hours. -------------------------------------------------------------------------------------------------------------------  Cardiac Enzymes  Recent Labs Lab 04/13/15 2257  TROPONINI 0.04*   ------------------------------------------------------------------------------------------------------------------ Invalid input(s): POCBNP  ---------------------------------------------------------------------------------------------------------------  Urinalysis    Component Value Date/Time   COLORURINE YELLOW* 04/13/2015 2258   APPEARANCEUR CLEAR* 04/13/2015 2258   LABSPEC 1.027  04/13/2015 2258   PHURINE 6.0 04/13/2015 2258   GLUCOSEU NEGATIVE 04/13/2015 2258   HGBUR NEGATIVE 04/13/2015 2258   BILIRUBINUR NEGATIVE 04/13/2015 2258   KETONESUR 1+* 04/13/2015 2258   PROTEINUR 30* 04/13/2015 2258   NITRITE NEGATIVE 04/13/2015 2258   LEUKOCYTESUR NEGATIVE 04/13/2015 2258     RADIOLOGY: Dg Chest 2 Lindsey  04/13/2015  CLINICAL DATA:  Chronic cough, shortness of breath, generalized weakness and fever. Initial encounter. EXAM: CHEST  2 Lindsey COMPARISON:  None. FINDINGS: The lungs are well-aerated. Vascular congestion is noted. Mildly increased lung markings may reflect atelectasis or possibly mild infection, given the patient's symptoms. There is no evidence of pleural effusion or pneumothorax. The heart is normal in size; the mediastinal contour is within normal limits. No acute osseous abnormalities are seen. IMPRESSION: Vascular congestion noted. Mildly increased lung markings may reflect atelectasis or possibly mild infection, given the patient's symptoms. Electronically Signed   By: Garald Balding M.D.   On: 04/13/2015 23:43    EKG: Orders placed or performed during the hospital encounter of 04/13/15  . ED EKG 12-Lead  . ED EKG 12-Lead    IMPRESSION AND PLAN: 60 year old male patient with history of prostate cancer, hypothyroidism presented to the emergency room with cough, fever and chills. Admitting diagnosis 1. Community-acquired pneumonia 2. Sepsis secondary to pneumonia 3. Dehydration 4. Hypothyroidism Treatment plan Admit patient to telemetry under inpatient service Start patient on IV Rocephin and Zithromax antibiotics Follow-up cultures Follow-up electrolytes IV fluid hydration DVT prophylaxis Lovenox Supportive care.  All the records are reviewed and case discussed with ED provider. Management plans discussed with the patient, family and they are in agreement.  CODE STATUS:FULL  Code Status Orders        Start     Ordered   04/14/15 0040   Full code   Continuous     04/14/15 0044    Code Status History    Date Active Date Inactive Code Status Order ID Comments User Context   This patient has a current code status but no historical code status.       TOTAL TIME TAKING CARE OF THIS PATIENT: 50 minutes.    Saundra Shelling M.D on 04/14/2015 at 12:44 AM  Between 7am to 6pm - Pager - [phone]  After 6pm go to [email or web address] - password [email or web address] [facility]  [facility]  Office  [phone]  CC: Primary care physician; Ria Bush, MD

## 2015-04-14 NOTE — Progress Notes (Signed)
[place] [facility] [place] NAME: Philip Lindsey    MR#:  [id number]  DATE OF BIRTH:  05-Dec-1955  SUBJECTIVE:   Patient presents with cough and fever. He reports he is feeling better than he did on admission.  REVIEW OF SYSTEMS:    Review of Systems  Constitutional: Positive for fever and malaise/fatigue. Negative for chills.  HENT: Negative for sore throat.   Eyes: Negative for blurred vision.  Respiratory: Positive for cough, sputum production and shortness of breath. Negative for hemoptysis and wheezing.   Cardiovascular: Negative for chest pain, palpitations and leg swelling.  Gastrointestinal: Negative for nausea, vomiting, abdominal pain, diarrhea and blood in stool.  Genitourinary: Negative for dysuria.  Musculoskeletal: Negative for back pain.  Neurological: Positive for weakness. Negative for dizziness, tremors and headaches.  Endo/Heme/Allergies: Does not bruise/bleed easily.    Tolerating Diet: Yes      DRUG ALLERGIES:  No Known Allergies  VITALS:  Blood pressure 134/65, pulse 108, temperature 98.8 F (37.1 C), temperature source Oral, resp. rate 20, height 6\' 2"  (1.88 m), weight 115.304 kg (254 lb 3.2 oz), SpO2 99 %.  PHYSICAL EXAMINATION:   Physical Exam  Constitutional: He is oriented to person, place, and time and well-developed, well-nourished, and in no distress. No distress.  HENT:  Head: Normocephalic.  Eyes: No scleral icterus.  Neck: Normal range of motion. Neck supple. No JVD present. No tracheal deviation present.  Cardiovascular: Normal rate, regular rhythm and normal heart sounds.  Exam reveals no gallop and no friction rub.   No murmur heard. Pulmonary/Chest: Effort normal. No respiratory distress. He has wheezes. He has no rales. He exhibits no tenderness.  Abdominal: Soft. Bowel sounds are normal. He exhibits no distension and no mass. There is no tenderness. There is no rebound and no guarding.   Musculoskeletal: Normal range of motion. He exhibits no edema.  Neurological: He is alert and oriented to person, place, and time.  Skin: Skin is warm. No rash noted. No erythema.  Psychiatric: Affect and judgment normal.      LABORATORY PANEL:   CBC  Recent Labs Lab 04/14/15 0426  WBC 5.2  HGB 11.1*  HCT 33.5*  PLT 176   ------------------------------------------------------------------------------------------------------------------  Chemistries   Recent Labs Lab 04/14/15 0426  NA 143  K 3.2*  CL 114*  CO2 25  GLUCOSE 140*  BUN 11  CREATININE 0.80  CALCIUM 6.4*  MG 1.5*  AST 41  ALT 35  ALKPHOS 58  BILITOT 0.5   ------------------------------------------------------------------------------------------------------------------  Cardiac Enzymes  Recent Labs Lab 04/13/15 2257 04/14/15 0426  TROPONINI 0.04* 0.04*   ------------------------------------------------------------------------------------------------------------------  RADIOLOGY:  Dg Chest 2 View  04/13/2015  CLINICAL DATA:  Chronic cough, shortness of breath, generalized weakness and fever. Initial encounter. EXAM: CHEST  2 VIEW COMPARISON:  None. FINDINGS: The lungs are well-aerated. Vascular congestion is noted. Mildly increased lung markings may reflect atelectasis or possibly mild infection, given the patient's symptoms. There is no evidence of pleural effusion or pneumothorax. The heart is normal in size; the mediastinal contour is within normal limits. No acute osseous abnormalities are seen. IMPRESSION: Vascular congestion noted. Mildly increased lung markings may reflect atelectasis or possibly mild infection, given the patient's symptoms. Electronically Signed   By: Garald Balding M.D.   On: 04/13/2015 23:43     ASSESSMENT AND PLAN:   60-year-old male with a history of prostate cancer, hypothyroidism who presented with sepsis from community-acquired pneumonia.  1.  Sepsis: Patient meets  criteria by fever and tachycardia. Sepsis from pneumonia, community-acquired. Continue Rocephin and Zithromax. Blood cultures are negative to date.  2. Community-acquired pneumonia: Continue Rocephin and Zithromax and follow up on blood culture.  3. Elevated troponin: This is demand ischemia and not ACS.  4. Hypokalemia: Replete potassium and recheck in a.m.  5. Hypocalcemia: Replete calcium and recheck in a.m. Pharmacy is assisting with dosing. I am suspecting this is due to sepsis. Albumin is 2.9. Calcium is 7.4 with correction.  6. Sinus tachycardia: This is due to fever and sepsis. Continue telemetry   Management plans discussed with the patient and he is in agreement.  CODE STATUS: FLL  TOTAL TIME TAKING CARE OF THIS PATIENT: 30 minutes.     POSSIBLE D/C 1-2 days, DEPENDING ON CLINICAL CONDITION.   Marciana Uplinger M.D on 04/14/2015 at 11:10 AM  Between 7am to 6pm - Pager - [phone] After 6pm go to [email or web address] - password [email or web address] [facility]  Office  [phone]  CC: Primary care physician; Ria Bush, MD  Note: This dictation was prepared with Dragon dictation along with smaller phrase technology. Any transcriptional errors that result from this process are unintentional.

## 2015-04-14 NOTE — Progress Notes (Signed)
MEDICATION RELATED CONSULT NOTE - INITIAL   Pharmacy Consult for Electrolyte management  Indication: Hypocalcemia  No Known Allergies  Patient Measurements: Height: 6\' 2"  (188 cm) Weight: 254 lb 3.2 oz (115.304 kg) IBW/kg (Calculated) : 82.2 Adjusted Body Weight:   Vital Signs: Temp: 98.8 F (37.1 C) (02/07 0228) Temp Source: Oral (02/07 0228) BP: 134/65 mmHg (02/07 0228) Pulse Rate: 108 (02/07 0228) Intake/Output from previous day: 02/06 0701 - 02/07 0700 In: 731.7 [I.V.:231.7; IV Piggyback:500] Out: 1100 [Urine:1100] Intake/Output from this shift: Total I/O In: -  Out: 325 [Urine:325]  Labs:  Recent Labs  04/13/15 2257 04/14/15 0426  WBC 9.4 5.2  HGB 13.6 11.1*  HCT 40.3 33.5*  PLT 217 176  APTT 30 33  CREATININE 0.88 0.80  MG  --  1.5*  PHOS  --  2.9  ALBUMIN 4.0 2.9*  PROT 7.2 5.3*  AST 50* 41  ALT 49 35  ALKPHOS 85 58  BILITOT 0.7 0.5   Estimated Creatinine Clearance: 132.5 mL/min (by C-G formula based on Cr of 0.8).   Microbiology: Recent Results (from the past 720 hour(s))  Rapid Influenza A&B Antigens ([place] only)     Status: None   Collection Time: 04/13/15 10:57 PM  Result Value Ref Range Status   Influenza A [facility]) NOT DETECTED  Final   Influenza B ([facility]) NOT DETECTED  Final  Blood Culture (routine x 2)     Status: None (Preliminary result)   Collection Time: 04/13/15 10:58 PM  Result Value Ref Range Status   Specimen Description BLOOD LEFT ANTECUBITAL  Final   Special Requests BOTTLES DRAWN AEROBIC AND ANAEROBIC 5ML  Final   Culture NO GROWTH < 12 HOURS  Final   Report Status PENDING  Incomplete  Blood Culture (routine x 2)     Status: None (Preliminary result)   Collection Time: 04/13/15 10:58 PM  Result Value Ref Range Status   Specimen Description BLOOD RIGHT ANTECUBITAL  Final   Special Requests BOTTLES DRAWN AEROBIC AND ANAEROBIC 10ML  Final   Culture NO GROWTH < 12 HOURS  Final   Report Status PENDING  Incomplete  Urine culture      Status: None (Preliminary result)   Collection Time: 04/13/15 10:58 PM  Result Value Ref Range Status   Specimen Description URINE, CLEAN CATCH  Final   Special Requests NONE  Final   Culture NO GROWTH < 12 HOURS  Final   Report Status PENDING  Incomplete    Medical History: Past Medical History  Diagnosis Date  . History of prostate cancer 2004    prostatectomy 2004  . History of chicken pox   . Hypothyroid 2012    on synthroid    Medications:  Prescriptions prior to admission  Medication Sig Dispense Refill Last Dose  . levothyroxine (SYNTHROID, LEVOTHROID) 50 MCG tablet Take 50 mcg by mouth daily. Reported on 04/13/2015   Not Taking at Unknown time   Scheduled:  . [START ON 04/15/2015] azithromycin  500 mg Intravenous Q24H  . calcium gluconate  2 g Intravenous Once  . [START ON 04/15/2015] cefTRIAXone (ROCEPHIN)  IV  1 g Intravenous Q24H  . enoxaparin (LOVENOX) injection  40 mg Subcutaneous Q24H  . levothyroxine  50 mcg Oral QAC breakfast  . magnesium sulfate 1 - 4 g bolus IVPB  2 g Intravenous Once  . pantoprazole  40 mg Oral Daily  . sodium chloride flush  3 mL Intravenous Q12H    Assessment: Pharmacy consulted to manage  electrolytes in this 60 year old patient. All electrolytes within normal limits EXCEPT Mag= 1.5 and Corrected Calcium= 7.29  Goal of Therapy:  Normalization of electrolytes   Plan:  Magnesium needs to be replaced prior to correcting Ca; therefore will order Magnesium 2 g IV x 1 and will also order Calcium gluconate 2 g IV x 1. Will reorder electrolyte panel with am labs.    Jagar Lua D 04/14/2015,10:02 AM

## 2015-04-14 NOTE — Progress Notes (Signed)
When temp reassessed, elevated again. Earlier Dr. Benjie Karvonen ordered ibuprofen for temp if elevated again. Ibuprofen given. Wilnette Kales

## 2015-04-14 NOTE — Progress Notes (Signed)
Notified MD about critical calcium level, and low potassium. New orders received.

## 2015-04-14 NOTE — ED Notes (Signed)
Pt to be transported to room 255 via stretcher on monitor and IV pump with RN.

## 2015-04-15 ENCOUNTER — Inpatient Hospital Stay: Payer: BLUE CROSS/BLUE SHIELD

## 2015-04-15 DIAGNOSIS — A419 Sepsis, unspecified organism: Secondary | ICD-10-CM

## 2015-04-15 DIAGNOSIS — J189 Pneumonia, unspecified organism: Principal | ICD-10-CM

## 2015-04-15 LAB — MAGNESIUM: MAGNESIUM: 1.9 mg/dL (ref 1.7–2.4)

## 2015-04-15 LAB — BLOOD GAS, ARTERIAL
ACID-BASE EXCESS: 1.8 mmol/L (ref 0.0–3.0)
Allens test (pass/fail): POSITIVE — AB
BICARBONATE: 25.7 meq/L (ref 21.0–28.0)
FIO2: 100
O2 SAT: 98.7 %
PCO2 ART: 37 mmHg (ref 32.0–48.0)
Patient temperature: 37
pH, Arterial: 7.45 (ref 7.350–7.450)
pO2, Arterial: 117 mmHg — ABNORMAL HIGH (ref 83.0–108.0)

## 2015-04-15 LAB — BASIC METABOLIC PANEL
ANION GAP: 11 (ref 5–15)
BUN: 11 mg/dL (ref 6–20)
CALCIUM: 7.3 mg/dL — AB (ref 8.9–10.3)
CO2: 20 mmol/L — ABNORMAL LOW (ref 22–32)
Chloride: 102 mmol/L (ref 101–111)
Creatinine, Ser: 0.86 mg/dL (ref 0.61–1.24)
GFR calc Af Amer: >60 mL/min (ref >60)
Glucose, Bld: 184 mg/dL — ABNORMAL HIGH (ref 65–99)
POTASSIUM: 3.6 mmol/L (ref 3.5–5.1)
SODIUM: 133 mmol/L — AB (ref 135–145)

## 2015-04-15 LAB — URINE CULTURE

## 2015-04-15 LAB — GLUCOSE, CAPILLARY: Glucose-Capillary: 159 mg/dL — ABNORMAL HIGH (ref 65–99)

## 2015-04-15 LAB — MRSA PCR SCREENING: MRSA BY PCR: NEGATIVE

## 2015-04-15 MED ORDER — DEXTROSE 5 % IV SOLN
2.0000 g | Freq: Three times a day (TID) | INTRAVENOUS | Status: DC
  Administered 2015-04-15 – 2015-04-17 (×6): 2 g via INTRAVENOUS
  Filled 2015-04-15 (×12): qty 2

## 2015-04-15 MED ORDER — VANCOMYCIN HCL IN DEXTROSE 1-5 GM/200ML-% IV SOLN
1000.0000 mg | Freq: Three times a day (TID) | INTRAVENOUS | Status: DC
  Administered 2015-04-16: 1000 mg via INTRAVENOUS
  Filled 2015-04-15 (×7): qty 200

## 2015-04-15 MED ORDER — FUROSEMIDE 10 MG/ML IJ SOLN
40.0000 mg | Freq: Once | INTRAMUSCULAR | Status: AC
  Administered 2015-04-15: 40 mg via INTRAVENOUS
  Filled 2015-04-15: qty 4

## 2015-04-15 MED ORDER — VANCOMYCIN HCL IN DEXTROSE 1-5 GM/200ML-% IV SOLN
1000.0000 mg | Freq: Once | INTRAVENOUS | Status: AC
Start: 2015-04-15 — End: 2015-04-15
  Administered 2015-04-15: 1000 mg via INTRAVENOUS
  Filled 2015-04-15 (×2): qty 200

## 2015-04-15 NOTE — Therapy (Signed)
  Called to patient room by nurse due to increased FiO2 requirements. Patient found on NRB with SpO2 98%, RR 32. HR 114, some increased work of breathing noted. Pulmonology consulted, ABG drawn, order received for BiPAP and transfer to CCU after CT scan.

## 2015-04-15 NOTE — Progress Notes (Signed)
eLink Physician-Brief Progress Note Patient Name: Philip Lindsey DOB: 1956-01-08 MRN: [id number]   Date of Service  04/15/2015  HPI/Events of Note  Camera check-seems to be doing ok on transitioned to [place] from noninvasive vent Breathing comfortably  eICU Interventions  CT chest shows multifocal areas of GGO's pneumonia vs edema, no other interventions at this time     Intervention Category Evaluation Type: Other  Valor Quaintance 04/15/2015, 11:07 PM

## 2015-04-15 NOTE — Progress Notes (Signed)
Patient transported to ICU. Report given to [facility]. Patient is stable on BiPaP. RN will continue to monitor.

## 2015-04-15 NOTE — Progress Notes (Signed)
Pt arrived from 2A at 19:00 on bipap FiO2 80% no respiratory distress present; pt alert and oriented; sinus tach on cardiac monitor; report given to night shift RN Zach nurse aware vancomycin due at 18:30 still needs to be administered

## 2015-04-15 NOTE — Progress Notes (Signed)
Patient on non re-breather with O2 sats 98%. Notified respiratory for further assessment. Per Dr. Ashby Dawes patient will need Bipap and transfer to ICU. Per Dr. Benjie Karvonen give patient lasix 40 mg, order CT chest w/contrast, and consult to pulmonology. Will continue to monitor patient.

## 2015-04-15 NOTE — Care Management (Signed)
Admitted to 2A with sepsis due to pneumonia.  He was transferred to icu due to need for continuous bipap.  He does not have chronic 02 at home.

## 2015-04-15 NOTE — Progress Notes (Signed)
MEDICATION RELATED CONSULT NOTE - FOLLOW UP   Pharmacy Consult for Electrolyte management  Indication: Hypocalcemia  No Known Allergies  Patient Measurements: Height: 6\' 2"  (188 cm) Weight: 254 lb 3.2 oz (115.304 kg) IBW/kg (Calculated) : 82.2 Adjusted Body Weight:   Vital Signs: Temp: 99.2 F (37.3 C) (02/08 0833) Temp Source: Oral (02/08 0833) BP: 115/64 mmHg (02/08 0833) Pulse Rate: 90 (02/08 0921) Intake/Output from previous day: 02/07 0701 - 02/08 0700 In: [id number] [P.O.:240; I.V.:1155] Out: 1025 [Urine:1025] Intake/Output from this shift: Total I/O In: -  Out: 500 [Urine:500]  Labs:  Recent Labs  04/13/15 2257 04/14/15 0426 04/15/15 0515  WBC 9.4 5.2  --   HGB 13.6 11.1*  --   HCT 40.3 33.5*  --   PLT 217 176  --   APTT 30 33  --   CREATININE 0.88 0.80 0.86  MG  --  1.5* 1.9  PHOS  --  2.9  --   ALBUMIN 4.0 2.9*  --   PROT 7.2 5.3*  --   AST 50* 41  --   ALT 49 35  --   ALKPHOS 85 58  --   BILITOT 0.7 0.5  --    Estimated Creatinine Clearance: 123.3 mL/min (by C-G formula based on Cr of 0.86).   Microbiology: Recent Results (from the past 720 hour(s))  Rapid Influenza A&B Antigens ([place] only)     Status: None   Collection Time: 04/13/15 10:57 PM  Result Value Ref Range Status   Influenza A [facility]) NOT DETECTED  Final   Influenza B ([facility]) NOT DETECTED  Final  Blood Culture (routine x 2)     Status: None (Preliminary result)   Collection Time: 04/13/15 10:58 PM  Result Value Ref Range Status   Specimen Description BLOOD LEFT ANTECUBITAL  Final   Special Requests BOTTLES DRAWN AEROBIC AND ANAEROBIC 5ML  Final   Culture NO GROWTH < 12 HOURS  Final   Report Status PENDING  Incomplete  Blood Culture (routine x 2)     Status: None (Preliminary result)   Collection Time: 04/13/15 10:58 PM  Result Value Ref Range Status   Specimen Description BLOOD RIGHT ANTECUBITAL  Final   Special Requests BOTTLES DRAWN AEROBIC AND ANAEROBIC 10ML  Final   Culture NO  GROWTH < 12 HOURS  Final   Report Status PENDING  Incomplete  Urine culture     Status: None   Collection Time: 04/13/15 10:58 PM  Result Value Ref Range Status   Specimen Description URINE, CLEAN CATCH  Final   Special Requests NONE  Final   Culture MULTIPLE SPECIES PRESENT, SUGGEST RECOLLECTION  Final   Report Status 04/15/2015 FINAL  Final    Medical History: Past Medical History  Diagnosis Date  . History of prostate cancer 2004    prostatectomy 2004  . History of chicken pox   . Hypothyroid 2012    on synthroid    Medications:  Prescriptions prior to admission  Medication Sig Dispense Refill Last Dose  . levothyroxine (SYNTHROID, LEVOTHROID) 50 MCG tablet Take 50 mcg by mouth daily. Reported on 04/13/2015   Not Taking at Unknown time   Scheduled:  . azithromycin  500 mg Intravenous Q24H  . cefTRIAXone (ROCEPHIN)  IV  1 g Intravenous Q24H  . enoxaparin (LOVENOX) injection  40 mg Subcutaneous Q24H  . levothyroxine  50 mcg Oral QAC breakfast  . pantoprazole  40 mg Oral Daily  . sodium chloride flush  3 mL Intravenous  Q12H    Assessment: Pharmacy consulted to manage electrolytes in this 60 year old patient. All electrolytes within normal limits   Goal of Therapy:  Normalization of electrolytes   Plan:  No supplementation warranted at this time.    Lavaeh Bau D 04/15/2015,10:59 AM

## 2015-04-15 NOTE — Progress Notes (Signed)
[name] [facility] NAME: Philip Lindsey    MR#:  [id number]  DATE OF BIRTH:  03/22/1955  SUBJECTIVE:   Patient has fever. Cough and shortness of breath have improved  REVIEW OF SYSTEMS:    Review of Systems  Constitutional: Positive for fever. Negative for chills.  HENT: Negative for sore throat.   Eyes: Negative for blurred vision.  Respiratory: Positive for cough, sputum production and shortness of breath. Negative for hemoptysis and wheezing.   Cardiovascular: Negative for chest pain, palpitations and leg swelling.  Gastrointestinal: Negative for nausea, vomiting, abdominal pain, diarrhea and blood in stool.  Genitourinary: Negative for dysuria.  Musculoskeletal: Negative for back pain.  Neurological: Negative for dizziness, tremors, weakness and headaches.  Endo/Heme/Allergies: Does not bruise/bleed easily.    Tolerating Diet: Yes      DRUG ALLERGIES:  No Known Allergies  VITALS:  Blood pressure 115/64, pulse 90, temperature 99.2 F (37.3 C), temperature source Oral, resp. rate 22, height 6\' 2"  (1.88 m), weight 115.304 kg (254 lb 3.2 oz), SpO2 91 %.  PHYSICAL EXAMINATION:   Physical Exam  Constitutional: He is oriented to person, place, and time and well-developed, well-nourished, and in no distress. No distress.  HENT:  Head: Normocephalic.  Eyes: No scleral icterus.  Neck: Normal range of motion. Neck supple. No JVD present. No tracheal deviation present.  Cardiovascular: Normal rate, regular rhythm and normal heart sounds.  Exam reveals no gallop and no friction rub.   No murmur heard. Pulmonary/Chest: Effort normal. No respiratory distress. He has wheezes. He has no rales. He exhibits no tenderness.  Abdominal: Soft. Bowel sounds are normal. He exhibits no distension and no mass. There is no tenderness. There is no rebound and no guarding.  Musculoskeletal: Normal range of motion. He exhibits no edema.   Neurological: He is alert and oriented to person, place, and time.  Skin: Skin is warm. No rash noted. No erythema.  Psychiatric: Affect and judgment normal.      LABORATORY PANEL:   CBC  Recent Labs Lab 04/14/15 0426  WBC 5.2  HGB 11.1*  HCT 33.5*  PLT 176   ------------------------------------------------------------------------------------------------------------------  Chemistries   Recent Labs Lab 04/14/15 0426 04/15/15 0515  NA 143 133*  K 3.2* 3.6  CL 114* 102  CO2 25 20*  GLUCOSE 140* 184*  BUN 11 11  CREATININE 0.80 0.86  CALCIUM 6.4* 7.3*  MG 1.5* 1.9  AST 41  --   ALT 35  --   ALKPHOS 58  --   BILITOT 0.5  --    ------------------------------------------------------------------------------------------------------------------  Cardiac Enzymes  Recent Labs Lab 04/14/15 1032 04/14/15 1330 04/14/15 1847  TROPONINI 0.07* 0.05* 0.03   ------------------------------------------------------------------------------------------------------------------  RADIOLOGY:  Dg Chest 1 View  04/15/2015  CLINICAL DATA:  CHF, community-acquired pneumonia, sepsis. EXAM: CHEST 1 VIEW COMPARISON:  PA and lateral chest x-ray of April 13, 2015 FINDINGS: The lungs remain hyperinflated. The interstitial markings remain increased and are slightly more conspicuous at both lung bases. There is no pleural effusion or pneumothorax. The heart is normal in size. The pulmonary vascularity is not engorged. The trachea is midline. The observed bony thorax exhibits no acute abnormality. IMPRESSION: Worsening of interstitial densities in the mid and lower lungs most compatible with worsening of pneumonia superimposed on COPD. There is stable mild central pulmonary vascular congestion without cardiomegaly. Electronically Signed   By: David  Martinique M.D.   On: 04/15/2015 07:27   Dg  Chest 2 View  04/13/2015  CLINICAL DATA:  Chronic cough, shortness of breath, generalized weakness and fever.  Initial encounter. EXAM: CHEST  2 VIEW COMPARISON:  None. FINDINGS: The lungs are well-aerated. Vascular congestion is noted. Mildly increased lung markings may reflect atelectasis or possibly mild infection, given the patient's symptoms. There is no evidence of pleural effusion or pneumothorax. The heart is normal in size; the mediastinal contour is within normal limits. No acute osseous abnormalities are seen. IMPRESSION: Vascular congestion noted. Mildly increased lung markings may reflect atelectasis or possibly mild infection, given the patient's symptoms. Electronically Signed   By: Garald Balding M.D.   On: 04/13/2015 23:43     ASSESSMENT AND PLAN:   60-year-old male with a history of prostate cancer, hypothyroidism who presented with sepsis from community-acquired pneumonia.  1. Sepsis: Patient meets criteria by fever and tachycardia. Sepsis from pneumonia, community-acquired. Continue Rocephin and Zithromax. Blood cultures are negative to date.  2. Community-acquired pneumonia: Continue Rocephin and Zithromax and follow up on blood culture. Patient continues have fevers and I will consult pulmonary.  3. Elevated troponin: This is demand ischemia and not ACS. Echocardiogram shows normal ejection fraction without valvular abnormalities. 4. Hypokalemia: improved  5. Hypocalcemia: improved.  6. Sinus tachycardia: This is due to fever and sepsis. Continue telemetry   Management plans discussed with the patient and he is in agreement.  CODE STATUS: FULL  TOTAL TIME TAKING CARE OF THIS PATIENT: 28 minutes.     POSSIBLE D/C 1-2 days, DEPENDING ON CLINICAL CONDITION.   Charday Capetillo M.D on 04/15/2015 at 10:59 AM  Between 7am to 6pm - Pager - [phone] After 6pm go to [email or web address] - password [email or web address] [facility]  Office  [phone]  CC: Primary care physician; Ria Bush, MD  Note: This dictation was prepared with Dragon dictation along with smaller  phrase technology. Any transcriptional errors that result from this process are unintentional.

## 2015-04-15 NOTE — Progress Notes (Signed)
Nurse called this evening. Patient with decreased O2 on 2 liters and over course of early evening O2 requirement increased. HE was on NRB at 90% and spiked fever of >101.  I called Dr Ashby Dawes due to sudden onset of decompensation. I ordered non contrast CT to evaluate severity of PNA vs ARDS.  Due to high risk of respiratory compromise/failure, patient is transferred to CCU.  I called patient's wife and updated her.  Continue with broader spectrum antibiotics as RECOMOMENDED by Dr Ashby Dawes  TIME SPENT 30 minutes

## 2015-04-15 NOTE — Consult Note (Signed)
[facility] Consultation     ASSESSMENT/PLAN    PULMONARY -Acute respiratory failure, likely due to severe community-acquired pneumonia versus healthcare associated pneumonia with sepsis. -Possible developing ARDS. -Will  expand coverage of the patient's antibiotics to include gram-negative coverage as well as MRSA. -Check MRSA screen, sputum culture. Await blood culture and urine culture results. -CT chest to rule out PE pending. -We'll need to monitor this patient closely due to progressive respiratory failure and the possible need for mechanical ventilatory support. I discussed with the patient, the patient's nurse, and respiratory therapy, as well as the critical care charge nurse that we will be moving the patient to the intensive care unit for closer monitoring.  -The patient confirms that his CODE STATUS is full code and he would want to be put on life support, "if I have to."  CARDIOVASCULAR --  RENAL A:  --  GASTROINTESTINAL A:  Will start H2 blocker for GI prophylaxis.  HEMATOLOGIC A:   P:    INFECTIOUS A:  Pneumonia with sepsis, continued fevers. P:   Will await results of cultures. Expand coverage of antibiotics. We'll check pro-calcitonin algorithm.   BCx2 pending UC pending Sputum not yet drawn Influenza screen negative Streptococcal urinary antigen negative.   Ceftriaxone 2/7>> 2/8 Azithromycin 2/7>> Cefepime 2/7>> Vancomycin 2/7>>  ENDOCRINE A:  Will monitor blood sugars per ICU protocol.  NEUROLOGIC A:    MAJOR EVENTS/TEST RESULTS:      ---------------------------------------  ---------------------------------------   Name: Philip Lindsey MRN: [id number] DOB: April 06, 1955    ADMISSION DATE:  04/13/2015 CONSULTATION DATE:  04/15/15  REFERRING MD :  Dr. Benjie Karvonen  CHIEF COMPLAINT:  Dyspnea   HISTORY OF PRESENT ILLNESS:    The patient is a 60 year old male who was in his usual state of health, without known  history of lung disease, previous history of smoking. The patient presented with increasing cough and dyspnea over the past one week. Subsequently was noted that this a.m. the patient's breathing was a little bit worse, he was put on oxygen previously at 2 L, however, the over the course the day. He has required progressively higher amounts of oxygen. Today, he was increased to a full nonrebreather after was noted that his oxygen was still low on 6 L nasal cannula. Currently, his oxygen saturation is 98% while on the nonrebreather mask. He notes that he continues to have some dyspnea. Review of chest x-ray films and report from 2/8 shows severe basal consolidation, which is progressing significantly from the previous film, which was taken on 2/6, at the time of admission. Currently being treated with ceftriaxone and azithromycin. His urine strep antigen was negative. ABG drawn today shows a pH of 7.45/37/117/25.7.  PAST MEDICAL HISTORY :  Past Medical History  Diagnosis Date  . History of prostate cancer 2004    prostatectomy 2004  . History of chicken pox   . Hypothyroid 2012    on synthroid   Past Surgical History  Procedure Laterality Date  . Knee arthroscopy w/ acl reconstruction  2001  . Prostatectomy  2004  . Colonoscopy  05/2014    normal, rpt 49yrs due to fam hx Oletta Lamas)   Prior to Admission medications   Medication Sig Start Date End Date Taking? Authorizing Provider  levothyroxine (SYNTHROID, LEVOTHROID) 50 MCG tablet Take 50 mcg by mouth daily. Reported on 04/13/2015    Historical Provider, MD   No Known Allergies  FAMILY HISTORY:  Family History  Problem Relation Age  of Onset  . Diabetes Mother   . Coronary artery disease Neg Hx   . Stroke Neg Hx   . Hypertension Neg Hx   . Hyperlipidemia Neg Hx   . Cancer Neg Hx    SOCIAL HISTORY:  reports that he quit smoking about 10 years ago. He has never used smokeless tobacco. He reports that he does not drink alcohol or use  illicit drugs.  REVIEW OF SYSTEMS:   Could not be obtained as the patient is critically ill on a nonrebreather.  VITAL SIGNS: Temp:  [98.3 F (36.8 C)-102.9 F (39.4 C)] 102.9 F (39.4 C) (02/08 1534) Pulse Rate:  [53-117] 116 (02/08 1549) Resp:  [22-34] 34 (02/08 1549) BP: (109-168)/(52-77) 168/69 mmHg (02/08 1549) SpO2:  [88 %-93 %] 88 % (02/08 1549) HEMODYNAMICS:   VENTILATOR SETTINGS:   INTAKE / OUTPUT:  Intake/Output Summary (Last 24 hours) at 04/15/15 1720 Last data filed at 04/15/15 1305  Gross per 24 hour  Intake   1395 ml  Output   1600 ml  Net   -205 ml    Physical Examination:   VS: BP 168/69 mmHg  Pulse 116  Temp(Src) 102.9 F (39.4 C) (Oral)  Resp 34  Ht 6\' 2"  (1.88 m)  Wt 254 lb 3.2 oz (115.304 kg)  BMI 32.62 kg/m2  SpO2 88%  General Appearance: No distress  Neuro:without focal findings, mental status, speech normal, alert and oriented, cranial nerves 2-12 intact, reflexes normal and symmetric, sensation grossly normal  HEENT: PERRLA, EOM intact, no ptosis, no other lesions noticed;  Pulmonary: normal breath sounds., diaphragmatic excursion normal.No wheezing, No rales;     CardiovascularNormal S1,S2.  No m/r/g.    Abdomen: Benign, Soft, non-tender, No masses, hepatosplenomegaly, No lymphadenopathy Renal:  No costovertebral tenderness  GU:  Not performed at this time. Endoc: No evident thyromegaly, no signs of acromegaly. Skin:   warm, no rashes, no ecchymosis  Extremities: normal, no cyanosis, clubbing, no edema, warm with normal capillary refill.    LABS: Reviewed   LABORATORY PANEL:   CBC  Recent Labs Lab 04/14/15 0426  WBC 5.2  HGB 11.1*  HCT 33.5*  PLT 176    Chemistries   Recent Labs Lab 04/14/15 0426 04/15/15 0515  NA 143 133*  K 3.2* 3.6  CL 114* 102  CO2 25 20*  GLUCOSE 140* 184*  BUN 11 11  CREATININE 0.80 0.86  CALCIUM 6.4* 7.3*  MG 1.5* 1.9  PHOS 2.9  --   AST 41  --   ALT 35  --   ALKPHOS 58  --     BILITOT 0.5  --     No results for input(s): GLUCAP in the last 168 hours.  Recent Labs Lab 04/15/15 1650  PHART 7.45  PCO2ART 37  PO2ART 117*    Recent Labs Lab 04/13/15 2257 04/14/15 0426  AST 50* 41  ALT 49 35  ALKPHOS 85 58  BILITOT 0.7 0.5  ALBUMIN 4.0 2.9*    Cardiac Enzymes  Recent Labs Lab 04/14/15 1847  TROPONINI 0.03    RADIOLOGY:  Dg Chest 1 View  04/15/2015  CLINICAL DATA:  CHF, community-acquired pneumonia, sepsis. EXAM: CHEST 1 VIEW COMPARISON:  PA and lateral chest x-ray of April 13, 2015 FINDINGS: The lungs remain hyperinflated. The interstitial markings remain increased and are slightly more conspicuous at both lung bases. There is no pleural effusion or pneumothorax. The heart is normal in size. The pulmonary vascularity is not engorged. The trachea is  midline. The observed bony thorax exhibits no acute abnormality. IMPRESSION: Worsening of interstitial densities in the mid and lower lungs most compatible with worsening of pneumonia superimposed on COPD. There is stable mild central pulmonary vascular congestion without cardiomegaly. Electronically Signed   By: David  Martinique M.D.   On: 04/15/2015 07:27   Dg Chest 2 View  04/13/2015  CLINICAL DATA:  Chronic cough, shortness of breath, generalized weakness and fever. Initial encounter. EXAM: CHEST  2 VIEW COMPARISON:  None. FINDINGS: The lungs are well-aerated. Vascular congestion is noted. Mildly increased lung markings may reflect atelectasis or possibly mild infection, given the patient's symptoms. There is no evidence of pleural effusion or pneumothorax. The heart is normal in size; the mediastinal contour is within normal limits. No acute osseous abnormalities are seen. IMPRESSION: Vascular congestion noted. Mildly increased lung markings may reflect atelectasis or possibly mild infection, given the patient's symptoms. Electronically Signed   By: Garald Balding M.D.   On: 04/13/2015 23:43       --Marda Stalker, MD.  Board Certified in Internal Medicine, Pulmonary Medicine, [place], and Sleep Medicine.  Pager [phone] [facility] Office Number: [phone]  Patricia Pesa, M.D.  Vilinda Boehringer, M.D.  Merton Border, M.D   04/15/2015, 5:20 PM  Critical Care Attestation.  I have personally obtained a history, examined the patient, evaluated laboratory and imaging results, formulated the assessment and plan and placed orders. The Patient requires high complexity decision making for assessment and support, frequent evaluation and titration of therapies, application of advanced monitoring technologies and extensive interpretation of multiple databases. The patient has critical illness that could lead imminently to failure of 1 or more organ systems and requires the highest level of physician preparedness to intervene.  Critical Care Time devoted to patient care services described in this note is 35 minutes and is exclusive of time spent in procedures.

## 2015-04-15 NOTE — Progress Notes (Signed)
Pharmacy Antibiotic Note  Philip Lindsey is a 60 y.o. male admitted on 04/13/2015 with pneumonia.  Pharmacy has been consulted for vancomycin & cefepime dosing.  Plan: Cefepime 2 g IV q 8 hours Vancomycin 100 mg x1 dose followed by a maintenance dose of 1000 mg IV q 8 hours (with stacked dosing to begin 6 hours after initial dose)  Vancomycin trough ordered for 2/10 @ 0030, which is prior to the 5th dose and should represent steady state.   Kinetics: Ke: 0.087 Half-life: 7.97 hours Vd: 66.5 L  Cmin (calculated) = 14.5 mcg/mL  Calculated/estimated trough is 14.5 mcg/mL, however patient is at risk for accumulation due to obesity so I anticipate that trough will be higher than predicted.  Height: 6\' 2"  (188 cm) Weight: 254 lb 3.2 oz (115.304 kg) IBW/kg (Calculated) : 82.2  Temp (24hrs), Avg:100.7 F (38.2 C), Min:98.3 F (36.8 C), Max:102.9 F (39.4 C)   Recent Labs Lab 04/13/15 2257 04/14/15 0426 04/15/15 0515  WBC 9.4 5.2  --   CREATININE 0.88 0.80 0.86  LATICACIDVEN 2.0 1.9  --     Estimated Creatinine Clearance: 123.3 mL/min (by C-G formula based on Cr of 0.86).    No Known Allergies  Antimicrobials this admission: azithromycin 2/7 >>  cefepime 2/8 >>  Vancomycin 2/8 >> CTX 2/7 >> 2/8  Dose adjustments this admission: n/a  Microbiology results: 2/6 BCx: no growth to date 2/6 UCx: multiple species present, suggest recollection  2/6 MRSA PCR: Sent  Thank you for allowing pharmacy to be a part of this patient's care.  Lenis Noon, PharmD Clinical Pharmacist 04/15/2015 6:33 PM

## 2015-04-16 ENCOUNTER — Encounter: Payer: Self-pay | Admitting: Radiology

## 2015-04-16 ENCOUNTER — Encounter: Payer: Self-pay | Admitting: Certified Registered Nurse Anesthetist

## 2015-04-16 ENCOUNTER — Inpatient Hospital Stay: Payer: BLUE CROSS/BLUE SHIELD

## 2015-04-16 DIAGNOSIS — J9601 Acute respiratory failure with hypoxia: Secondary | ICD-10-CM

## 2015-04-16 DIAGNOSIS — R739 Hyperglycemia, unspecified: Secondary | ICD-10-CM

## 2015-04-16 LAB — BASIC METABOLIC PANEL
ANION GAP: 6 (ref 5–15)
BUN: 12 mg/dL (ref 6–20)
CALCIUM: 7.1 mg/dL — AB (ref 8.9–10.3)
CHLORIDE: 102 mmol/L (ref 101–111)
CO2: 28 mmol/L (ref 22–32)
Creatinine, Ser: 0.87 mg/dL (ref 0.61–1.24)
GFR calc non Af Amer: >60 mL/min (ref >60)
Glucose, Bld: 203 mg/dL — ABNORMAL HIGH (ref 65–99)
POTASSIUM: 3 mmol/L — AB (ref 3.5–5.1)
Sodium: 136 mmol/L (ref 135–145)

## 2015-04-16 LAB — PROCALCITONIN
PROCALCITONIN: 1.48 ng/mL
Procalcitonin: 1.18 ng/mL

## 2015-04-16 LAB — GLUCOSE, CAPILLARY
GLUCOSE-CAPILLARY: 187 mg/dL — AB (ref 65–99)
Glucose-Capillary: 155 mg/dL — ABNORMAL HIGH (ref 65–99)
Glucose-Capillary: 160 mg/dL — ABNORMAL HIGH (ref 65–99)
Glucose-Capillary: 170 mg/dL — ABNORMAL HIGH (ref 65–99)
Glucose-Capillary: 177 mg/dL — ABNORMAL HIGH (ref 65–99)
Glucose-Capillary: 193 mg/dL — ABNORMAL HIGH (ref 65–99)

## 2015-04-16 LAB — TROPONIN I
TROPONIN I: 0.07 ng/mL — AB (ref <0.031)
Troponin I: 0.07 ng/mL — ABNORMAL HIGH (ref <0.031)

## 2015-04-16 LAB — POTASSIUM: POTASSIUM: 4.2 mmol/L (ref 3.5–5.1)

## 2015-04-16 MED ORDER — INSULIN ASPART 100 UNIT/ML ~~LOC~~ SOLN
0.0000 [IU] | Freq: Three times a day (TID) | SUBCUTANEOUS | Status: DC
  Administered 2015-04-16 (×2): 3 [IU] via SUBCUTANEOUS
  Administered 2015-04-17: 5 [IU] via SUBCUTANEOUS
  Administered 2015-04-17: 8 [IU] via SUBCUTANEOUS
  Administered 2015-04-17: 5 [IU] via SUBCUTANEOUS
  Administered 2015-04-18 (×3): 8 [IU] via SUBCUTANEOUS
  Administered 2015-04-19 (×2): 11 [IU] via SUBCUTANEOUS
  Administered 2015-04-19 – 2015-04-20 (×2): 5 [IU] via SUBCUTANEOUS
  Administered 2015-04-20: 11 [IU] via SUBCUTANEOUS
  Administered 2015-04-20: 8 [IU] via SUBCUTANEOUS
  Administered 2015-04-21: 11 [IU] via SUBCUTANEOUS
  Filled 2015-04-16: qty 11
  Filled 2015-04-16: qty 5
  Filled 2015-04-16: qty 8
  Filled 2015-04-16: qty 11
  Filled 2015-04-16 (×2): qty 8
  Filled 2015-04-16: qty 5
  Filled 2015-04-16: qty 3
  Filled 2015-04-16: qty 2
  Filled 2015-04-16: qty 8
  Filled 2015-04-16: qty 5
  Filled 2015-04-16: qty 3
  Filled 2015-04-16: qty 5
  Filled 2015-04-16: qty 11
  Filled 2015-04-16: qty 8

## 2015-04-16 MED ORDER — POTASSIUM CHLORIDE CRYS ER 20 MEQ PO TBCR
30.0000 meq | EXTENDED_RELEASE_TABLET | ORAL | Status: AC
  Administered 2015-04-16 (×2): 30 meq via ORAL
  Filled 2015-04-16 (×2): qty 1

## 2015-04-16 MED ORDER — METHYLPREDNISOLONE SODIUM SUCC 125 MG IJ SOLR
60.0000 mg | Freq: Two times a day (BID) | INTRAMUSCULAR | Status: DC
  Administered 2015-04-16: 60 mg via INTRAVENOUS
  Filled 2015-04-16 (×2): qty 2

## 2015-04-16 MED ORDER — METHYLPREDNISOLONE SODIUM SUCC 125 MG IJ SOLR
80.0000 mg | Freq: Two times a day (BID) | INTRAMUSCULAR | Status: DC
  Administered 2015-04-16: 80 mg via INTRAVENOUS
  Filled 2015-04-16: qty 2

## 2015-04-16 MED ORDER — INSULIN ASPART 100 UNIT/ML ~~LOC~~ SOLN
4.0000 [IU] | Freq: Three times a day (TID) | SUBCUTANEOUS | Status: DC
  Administered 2015-04-16 – 2015-04-22 (×13): 4 [IU] via SUBCUTANEOUS
  Filled 2015-04-16: qty 4
  Filled 2015-04-16: qty 5
  Filled 2015-04-16 (×7): qty 4
  Filled 2015-04-16: qty 11
  Filled 2015-04-16 (×4): qty 4

## 2015-04-16 MED ORDER — INSULIN ASPART 100 UNIT/ML ~~LOC~~ SOLN
0.0000 [IU] | Freq: Every day | SUBCUTANEOUS | Status: DC
  Administered 2015-04-17 – 2015-04-20 (×3): 2 [IU] via SUBCUTANEOUS
  Filled 2015-04-16: qty 2
  Filled 2015-04-16: qty 8
  Filled 2015-04-16: qty 11

## 2015-04-16 MED ORDER — IOHEXOL 350 MG/ML SOLN
100.0000 mL | Freq: Once | INTRAVENOUS | Status: AC | PRN
  Administered 2015-04-16: 100 mL via INTRAVENOUS

## 2015-04-16 NOTE — Progress Notes (Addendum)
[facility] [place] NAME: Philip Lindsey    MR#:  [id number]  DATE OF BIRTH:  11/10/1955  SUBJECTIVE:  Patient was transferred to CCU yesterday due to acute respiratory distress. He is feeling better this morning.  REVIEW OF SYSTEMS:    Review of Systems  Constitutional: Positive for fever. Negative for chills.  HENT: Negative for sore throat.   Eyes: Negative for blurred vision.  Respiratory: Positive for cough and shortness of breath. Negative for hemoptysis, sputum production and wheezing.   Cardiovascular: Negative for chest pain, palpitations and leg swelling.  Gastrointestinal: Negative for nausea, vomiting, abdominal pain, diarrhea and blood in stool.  Genitourinary: Negative for dysuria.  Musculoskeletal: Negative for back pain.  Neurological: Negative for dizziness, tremors, weakness and headaches.  Endo/Heme/Allergies: Does not bruise/bleed easily.    Tolerating Diet: Yes      DRUG ALLERGIES:  No Known Allergies  VITALS:  Blood pressure 128/80, pulse 90, temperature 97.9 F (36.6 C), temperature source Axillary, resp. rate 28, height 6' (1.829 m), weight 114.9 kg (253 lb 4.9 oz), SpO2 96 %.  PHYSICAL EXAMINATION:   Physical Exam  Constitutional: He is oriented to person, place, and time and well-developed, well-nourished, and in no distress. No distress.  HENT:  Head: Normocephalic.  Eyes: No scleral icterus.  Neck: Normal range of motion. Neck supple. No JVD present. No tracheal deviation present.  Cardiovascular: Normal rate, regular rhythm and normal heart sounds.  Exam reveals no gallop and no friction rub.   No murmur heard. Pulmonary/Chest: Effort normal. No respiratory distress. He has wheezes. He has rales. He exhibits no tenderness.  Abdominal: Soft. Bowel sounds are normal. He exhibits no distension and no mass. There is no tenderness. There is no rebound and no guarding.  Musculoskeletal: Normal  range of motion. He exhibits no edema.  Neurological: He is alert and oriented to person, place, and time.  Skin: Skin is warm. No rash noted. No erythema.  Psychiatric: Affect and judgment normal.      LABORATORY PANEL:   CBC  Recent Labs Lab 04/14/15 0426  WBC 5.2  HGB 11.1*  HCT 33.5*  PLT 176   ------------------------------------------------------------------------------------------------------------------  Chemistries   Recent Labs Lab 04/14/15 0426 04/15/15 0515 04/16/15 0413  NA 143 133* 136  K 3.2* 3.6 3.0*  CL 114* 102 102  CO2 25 20* 28  GLUCOSE 140* 184* 203*  BUN _0 CREATININE 0.80 0.86 0.87  CALCIUM 6.4* 7.3* 7.1*  MG 1.5* 1.9  --   AST 41  --   --   ALT 35  --   --   ALKPHOS 58  --   --   BILITOT 0.5  --   --    ------------------------------------------------------------------------------------------------------------------  Cardiac Enzymes  Recent Labs Lab 04/14/15 1847 04/16/15 0013 04/16/15 0413  TROPONINI 0.03 0.07* 0.07*   ------------------------------------------------------------------------------------------------------------------  RADIOLOGY:  Dg Chest 1 View  04/16/2015  CLINICAL DATA:  Shortness of breath . EXAM: CHEST 1 VIEW COMPARISON:  CT 04/16/2015 .  04/15/2015. FINDINGS: Mediastinum hilar structures normal. Mild cardiomegaly and pulmonary vascular prominence. Persistent multifocal pulmonary infiltrates noted most consistent with pneumonia. A component of pulmonary edema cannot be excluded. No pleural effusion or pneumothorax. IMPRESSION: 1. Persistent multifocal pulmonary infiltrates most consistent multifocal pneumonia. A component pulmonary edema cannot be excluded. No significant interim improvement. 2. Mild cardiomegaly and mild pulmonary vascular prominence . Electronically Signed   By: Marcello Moores  Register  On: 04/16/2015 07:36   Dg Chest 1 View  04/15/2015  CLINICAL DATA:  CHF, community-acquired pneumonia,  sepsis. EXAM: CHEST 1 VIEW COMPARISON:  PA and lateral chest x-ray of April 13, 2015 FINDINGS: The lungs remain hyperinflated. The interstitial markings remain increased and are slightly more conspicuous at both lung bases. There is no pleural effusion or pneumothorax. The heart is normal in size. The pulmonary vascularity is not engorged. The trachea is midline. The observed bony thorax exhibits no acute abnormality. IMPRESSION: Worsening of interstitial densities in the mid and lower lungs most compatible with worsening of pneumonia superimposed on COPD. There is stable mild central pulmonary vascular congestion without cardiomegaly. Electronically Signed   By: David  Martinique M.D.   On: 04/15/2015 07:27   Ct Chest Wo Contrast  04/15/2015  CLINICAL DATA:  Cough, fever, shortness of breath x1 week. History of prostate cancer. Pneumonia. EXAM: CT CHEST WITHOUT CONTRAST TECHNIQUE: Multidetector CT imaging of the chest was performed following the standard protocol without IV contrast. COMPARISON:  Chest radiograph dated 04/15/2015. FINDINGS: Mediastinum/Nodes: The heart is normal in size. No pericardial effusion. Atherosclerotic calcifications of the aortic arch. Small mediastinal lymph nodes measuring up to 9 mm short axis. Lungs/Pleura: Multifocal patchy/ ground-glass opacities throughout all lobes, bilateral lower lobe and right upper lobe predominant, suspicious for multifocal pneumonia. Underlying mild paraseptal emphysematous changes in the bilateral upper lobes. No suspicious pulmonary nodules. No pleural effusion or pneumothorax. Upper abdomen: Visualized upper abdomen is notable for moderate hepatic steatosis. Musculoskeletal: Degenerative changes of the visualized thoracolumbar spine. IMPRESSION: Multifocal pneumonia, bilateral lower lobe predominant. Electronically Signed   By: Julian Hy M.D.   On: 04/15/2015 18:25   Ct Angio Chest Pe W/cm &/or Wo Cm  04/16/2015  CLINICAL DATA:  Acute onset of  sepsis and pneumonia. Decreased O2 saturation. Initial encounter. EXAM: CT ANGIOGRAPHY CHEST WITH CONTRAST TECHNIQUE: Multidetector CT imaging of the chest was performed using the standard protocol during bolus administration of intravenous contrast. Multiplanar CT image reconstructions and MIPs were obtained to evaluate the vascular anatomy. CONTRAST:  170m OMNIPAQUE IOHEXOL 350 MG/ML SOLN COMPARISON:  CT of the chest performed 04/15/2015 FINDINGS: There is no evidence of pulmonary embolus. Diffuse bilateral lower lobe airspace opacification is noted, with ground-glass airspace opacity, somewhat worsened from the prior study. Additional patchy airspace opacities are seen within the remaining portions of both lungs bilaterally. This may reflect multifocal pneumonia, possibly atypical in nature. Certain inflammatory conditions could conceivably have a similar appearance. There is no evidence of pleural effusion or pneumothorax. No masses are identified; no abnormal focal contrast enhancement is seen. Visualized mediastinal nodes remain normal in size. No pericardial effusion is identified. The great vessels are grossly unremarkable in appearance. No axillary lymphadenopathy is seen. The visualized portions of the thyroid gland are unremarkable in appearance. The visualized portions of the liver and spleen are unremarkable. No acute osseous abnormalities are seen. Review of the MIP images confirms the above findings. IMPRESSION: 1. No evidence of pulmonary embolus. 2. Diffuse bilateral lower lobe airspace opacification, with ground-glass airspace opacity, somewhat worsened from the prior study. Additional patchy airspace opacities within the remaining portions of both lungs bilaterally. This is concerning for multifocal pneumonia, possibly atypical in nature. Certain inflammatory conditions could conceivably have a similar appearance. Electronically Signed   By: JGarald BaldingM.D.   On: 04/16/2015 01:10      ASSESSMENT AND PLAN:   60year-old male with a history of prostate cancer, hypothyroidism who presented with sepsis  from community-acquired pneumonia.  1. Sepsis: Patient met criteria by fever and tachycardia. Sepsis is from pneumonia, community-acquired.   2. Acute respiratory failure due to severe Community-acquired pneumonia: Agent decompensated on February 8. CT scan shows bilateral lower lobe airspace opacification with groundglass airspace disease. ? ARDS versus viral versus atypical bacterial pneumonia. Continue broad-spectrum antibiotics with cefepime and vancomycin. Should pulmonary consultation. CT chest was negative for pulmonary emboli. Continue IV steroids.  3. Elevated troponin: This is demand ischemia and not ACS. Echocardiogram shows normal ejection fraction without valvular abnormalities. 4. Hypokalemia: Pharmacy consult for replacement.   5. Hypocalcemia: improved.  6. Sinus tachycardia: This is due to fever and sepsis. Continue telemetry   Management plans discussed with the patient and he is in agreement.  CODE STATUS: FULL  Critical care TOTAL TIME TAKING CARE OF THIS PATIENT: 33 minutes.     POSSIBLE D/C ?, DEPENDING ON CLINICAL CONDITION.   Cinde Ebert M.D on 04/16/2015 at 11:45 AM  Between 7am to 6pm - Pager - [phone] After 6pm go to [email or web address] - password [email or web address] [facility]  Office  [phone]  CC: Primary care physician; Ria Bush, MD  Note: This dictation was prepared with Dragon dictation along with smaller phrase technology. Any transcriptional errors that result from this process are unintentional.

## 2015-04-16 NOTE — Progress Notes (Signed)
Pt's vitals remain elevated Temp 102.5, BP 163/73, HR 107, and RR 20's-30's. Pt denies any pain or discomfort. Lab called about troponin of .07.  Elink MD notified about vitals, troponin, and completed CT of chest. Orders to go ahead and give PRN ibuprofen for temp given. No other new orders given at this time. Continue current treatment per eLink MD. Will continue to monitor

## 2015-04-16 NOTE — Progress Notes (Signed)
[facility] ADULT ICU REPLACEMENT PROTOCOL FOR AM LAB REPLACEMENT ONLY  The patient does apply for the [facility] Adult ICU Electrolyte Replacment Protocol based on the criteria listed below:   1. Is GFR >/= 40 ml/min? Yes.    Patient's GFR today is >60 2. Is urine output >/= 0.5 ml/kg/hr for the last 6 hours? Yes.   Patient's UOP is 1.66 ml/kg/hr 3. Is BUN < 60 mg/dL? Yes.    Patient's BUN today is 12 4. Abnormal electrolyte(s): K-3.0 5. Ordered repletion with: per protocol 6. If a panic level lab has been reported, has the CCM MD in charge been notified? Yes.  .   Physician:  Dr. Leane Para, [place] 04/16/2015 5:47 AM

## 2015-04-16 NOTE — Progress Notes (Signed)
Pharmacy Antibiotic Note  Philip Lindsey is a 60 y.o. male admitted on 04/13/2015 with pneumonia.  Pharmacy has been consulted for vancomycin & cefepime dosing. MRSA PCR negative and vancomycin d/c. Azithromycin has been added.   Plan: Continue cefepime 2 g IV q 8 hours and azithromycin 500 mg iv q 24 hours.   Height: 6' (182.9 cm) Weight: 253 lb 4.9 oz (114.9 kg) IBW/kg (Calculated) : 77.6  Temp (24hrs), Avg:100.7 F (38.2 C), Min:97.9 F (36.6 C), Max:102.9 F (39.4 C)   Recent Labs Lab 04/13/15 2257 04/14/15 0426 04/15/15 0515 04/16/15 0413  WBC 9.4 5.2  --   --   CREATININE 0.88 0.80 0.86 0.87  LATICACIDVEN 2.0 1.9  --   --     Estimated Creatinine Clearance: 118.1 mL/min (by C-G formula based on Cr of 0.87).    No Known Allergies  Antimicrobials this admission: azithromycin 2/7 >>  cefepime 2/8 >>   Vancomycin 2/8 >> 2/9 CTX 2/7 >> 2/8  Dose adjustments this admission: n/a  Microbiology results: 2/6 BCx: no growth to date 2/6 UCx: multiple species present, suggest recollection  2/6 MRSA PCR: negative  Thank you for allowing pharmacy to be a part of this patient's care.  Ulice Dash D, PharmD Clinical Pharmacist 04/16/2015 12:01 PM

## 2015-04-16 NOTE — Progress Notes (Signed)
A&Ox4.  Slept on and off during most of shift unless company was in room. Son and daughter visited.  NSR per cardiac monitor. No respiratory distress.  o2 sats maintained on high flow.  Poor appetite.

## 2015-04-16 NOTE — Progress Notes (Signed)
Pharmacy Consult for Electrolyte Monitoring and Replacement Indication: Hypokalemia  No Known Allergies  Patient Measurements: Height: 6' (182.9 cm) Weight: 253 lb 4.9 oz (114.9 kg) IBW/kg (Calculated) : 77.6  Vital Signs: Temp: 98.3 F (36.8 C) (02/09 1700) Temp Source: Oral (02/09 1700) BP: 150/83 mmHg (02/09 1800) Pulse Rate: 94 (02/09 1800) Intake/Output from previous day: 02/08 0701 - 02/09 0700 In: [id number] [I.V.:3645; IV Piggyback:800] Out: 2725 [Urine:2725] Intake/Output from this shift:    Labs:  Recent Labs  04/13/15 2257 04/14/15 0426  WBC 9.4 5.2  HGB 13.6 11.1*  HCT 40.3 33.5*  PLT 217 176  APTT 30 33  INR 1.14 1.40     Recent Labs  04/13/15 2257 04/14/15 0426 04/15/15 0515 04/16/15 0413 04/16/15 1853  NA 137 143 133* 136  --   K 3.9 3.2* 3.6 3.0* 4.2  CL 102 114* 102 102  --   CO2 27 25 20* 28  --   GLUCOSE 206* 140* 184* 203*  --   BUN 14 11 11 12   --   CREATININE 0.88 0.80 0.86 0.87  --   CALCIUM 8.5* 6.4* 7.3* 7.1*  --   MG  --  1.5* 1.9  --   --   PHOS  --  2.9  --   --   --   PROT 7.2 5.3*  --   --   --   ALBUMIN 4.0 2.9*  --   --   --   AST 50* 41  --   --   --   ALT 49 35  --   --   --   ALKPHOS 85 58  --   --   --   BILITOT 0.7 0.5  --   --   --    Estimated Creatinine Clearance: 118.1 mL/min (by C-G formula based on Cr of 0.87).    Recent Labs  04/16/15 0725 04/16/15 1146 04/16/15 1718  GLUCAP 155* 187* 193*    Medical History: Past Medical History  Diagnosis Date  . History of prostate cancer 2004    prostatectomy 2004  . History of chicken pox   . Hypothyroid 2012    on synthroid  . Cancer (HCC)     Medications:  Scheduled:  . azithromycin  500 mg Intravenous Q24H  . ceFEPime (MAXIPIME) IV  2 g Intravenous 3 times per day  . insulin aspart  0-15 Units Subcutaneous TID WC  . insulin aspart  0-5 Units Subcutaneous QHS  . insulin aspart  4 Units Subcutaneous TID WC  . levothyroxine  50 mcg Oral QAC breakfast   . methylPREDNISolone (SOLU-MEDROL) injection  60 mg Intravenous Q12H  . sodium chloride flush  3 mL Intravenous Q12H   Infusions:    Assessment: Pharmacy consulted to assist in monitoring and replacing electrolytes in this 60 y/o M with sepsis from PNA.   Potassium within normal limits.   Plan:  No further supplementation warranted @ this time.  Dreya Buhrman D 04/16/2015,8:04 PM

## 2015-04-16 NOTE — Progress Notes (Signed)
eLink Physician-Brief Progress Note Patient Name: Philip Lindsey DOB: [birth year] MRN: [id number]   Date of Service  04/16/2015  HPI/Events of Note  Contacted regarding continued hypoxia. Pt saturating 88-91% on HFNC. On BiPAP last PM. Patient reportedly denies any dyspnea or fatigue.   eICU Interventions  Continue to monitor off BiPAP with low threshold for intubation.     Intervention Category Major Interventions: Respiratory failure - evaluation and management  Tera Partridge 04/16/2015, 11:18 PM

## 2015-04-16 NOTE — Clinical Documentation Improvement (Signed)
Hospitalist  Can the diagnosis of Respiratory Failure be further specified?   Document Inclusion Of - Hypoxia, Hypercapnia, Combination of Both  Other  Clinically Undetermined  Document any associated diagnoses/conditions.   Supporting Information: Acute respiratory failure due to severe community acquired pneumonia per 02/09 progress notes. O2 sat 80% per 02/08 RN progress note.   Please exercise your independent, professional judgment when responding. A specific answer is not anticipated or expected.   Thank You,  [place] [phone]

## 2015-04-16 NOTE — Progress Notes (Signed)
No distress off BiPAP. SpO2 87% on VM. Cognition intact. No new complaints  Filed Vitals:   04/16/15 1100 04/16/15 1200 04/16/15 1300 04/16/15 1400  BP: 128/80 142/80 141/81 135/79  Pulse: 90 90 92 97  Temp:  98.2 F (36.8 C)    TempSrc:  Oral    Resp: 28 19 26 20   Height:      Weight:      SpO2: 95% 94% 95% 95%   HEENT WNL No JVD B crackles, no wheezes Reg, no M NABS, soft Ext warm, no edema No focal neuro deficits  BMP Latest Ref Rng 04/16/2015 04/15/2015 04/14/2015  Glucose 65 - 99 mg/dL 203(H) 184(H) 140(H)  BUN 6 - 20 mg/dL 12 11 11   Creatinine 0.61 - 1.24 mg/dL 0.87 0.86 0.80  Sodium 135 - 145 mmol/L 136 133(L) 143  Potassium 3.5 - 5.1 mmol/L 3.0(L) 3.6 3.2(L)  Chloride 101 - 111 mmol/L 102 102 114(H)  CO2 22 - 32 mmol/L 28 20(L) 25  Calcium 8.9 - 10.3 mg/dL 7.1(L) 7.3(L) 6.4(LL)    CBC Latest Ref Rng 04/14/2015 04/13/2015  WBC 3.8 - 10.6 K/uL 5.2 9.4  Hemoglobin 13.0 - 18.0 g/dL 11.1(L) 13.6  Hematocrit 40.0 - 52.0 % 33.5(L) 40.3  Platelets 150 - 440 K/uL 176 217   Results for orders placed or performed during the hospital encounter of 04/13/15  Rapid Influenza A&B Antigens ([facility] only)     Status: None   Collection Time: 04/13/15 10:57 PM  Result Value Ref Range Status   Influenza A [facility]) NOT DETECTED  Final   Influenza B ([facility]) NOT DETECTED  Final  Blood Culture (routine x 2)     Status: None (Preliminary result)   Collection Time: 04/13/15 10:58 PM  Result Value Ref Range Status   Specimen Description BLOOD LEFT ANTECUBITAL  Final   Special Requests BOTTLES DRAWN AEROBIC AND ANAEROBIC 5ML  Final   Culture NO GROWTH 2 DAYS  Final   Report Status PENDING  Incomplete  Blood Culture (routine x 2)     Status: None (Preliminary result)   Collection Time: 04/13/15 10:58 PM  Result Value Ref Range Status   Specimen Description BLOOD RIGHT ANTECUBITAL  Final   Special Requests BOTTLES DRAWN AEROBIC AND ANAEROBIC 10ML  Final   Culture NO GROWTH 2 DAYS  Final   Report  Status PENDING  Incomplete  Urine culture     Status: None   Collection Time: 04/13/15 10:58 PM  Result Value Ref Range Status   Specimen Description URINE, CLEAN CATCH  Final   Special Requests NONE  Final   Culture MULTIPLE SPECIES PRESENT, SUGGEST RECOLLECTION  Final   Report Status 04/15/2015 FINAL  Final  MRSA PCR Screening     Status: None   Collection Time: 04/15/15  6:18 PM  Result Value Ref Range Status   MRSA by PCR NEGATIVE NEGATIVE Final    Comment:        The GeneXpert MRSA Assay (FDA approved for NASAL specimens only), is one component of a comprehensive MRSA colonization surveillance program. It is not intended to diagnose MRSA infection nor to guide or monitor treatment for MRSA infections.    Anti-infectives    Start     Dose/Rate Route Frequency Ordered Stop   04/16/15 0100  vancomycin (VANCOCIN) IVPB 1000 mg/200 mL premix  Status:  Discontinued     1,000 mg 200 mL/hr over 60 Minutes Intravenous Every 8 hours 04/15/15 1837 04/16/15 0935   04/15/15 1830  vancomycin (VANCOCIN) IVPB 1000 mg/200 mL premix     1,000 mg 200 mL/hr over 60 Minutes Intravenous  Once 04/15/15 1746 04/15/15 2230   04/15/15 1730  ceFEPIme (MAXIPIME) 2 g in dextrose 5 % 50 mL IVPB     2 g 100 mL/hr over 30 Minutes Intravenous 3 times per day 04/15/15 1717     04/15/15 1000  cefTRIAXone (ROCEPHIN) 1 g in dextrose 5 % 50 mL IVPB  Status:  Discontinued     1 g 100 mL/hr over 30 Minutes Intravenous Every 24 hours 04/14/15 0106 04/15/15 1715   04/15/15 0900  azithromycin (ZITHROMAX) 500 mg in dextrose 5 % 250 mL IVPB     500 mg 250 mL/hr over 60 Minutes Intravenous Every 24 hours 04/14/15 0106     04/14/15 0100  piperacillin-tazobactam (ZOSYN) IVPB 3.375 g  Status:  Discontinued     3.375 g 12.5 mL/hr over 240 Minutes Intravenous STAT 04/14/15 0053 04/14/15 0058   04/14/15 0100  piperacillin-tazobactam (ZOSYN) IVPB 3.375 g     3.375 g 100 mL/hr over 30 Minutes Intravenous STAT 04/14/15  0057 04/14/15 0120   04/14/15 0045  cefTRIAXone (ROCEPHIN) 1 g in dextrose 5 % 50 mL IVPB     1 g 100 mL/hr over 30 Minutes Intravenous  Once 04/14/15 0038 04/14/15 0205   04/14/15 0045  azithromycin (ZITHROMAX) 500 mg in dextrose 5 % 250 mL IVPB     500 mg 250 mL/hr over 60 Minutes Intravenous  Once 04/14/15 0038 04/14/15 0653   04/14/15 0000  azithromycin (ZITHROMAX) 500 mg in dextrose 5 % 250 mL IVPB  Status:  Discontinued     500 mg 250 mL/hr over 60 Minutes Intravenous  Once 04/13/15 2353 04/14/15 0106   04/13/15 2315  vancomycin (VANCOCIN) IVPB 1000 mg/200 mL premix     1,000 mg 200 mL/hr over 60 Minutes Intravenous  Once 04/13/15 2313 04/14/15 0049   04/13/15 2315  piperacillin-tazobactam (ZOSYN) IVPB 3.375 g  Status:  Discontinued     3.375 g 12.5 mL/hr over 240 Minutes Intravenous  Once 04/13/15 2313 04/14/15 0052      CXR: Patchy R>L infiltrates  CT chest (02/09): No evidence of pulmonary embolus. 2. Diffuse bilateral lower lobe airspace opacification, with ground-glass airspace opacity, somewhat worsened from the prior study. Additional patchy airspace opacities within the remaining portions of both lungs bilaterally. This is concerning for multifocal pneumonia, possibly atypical in nature    IMPRESSION: Acute hypoxic respiratory failure Pneumonitis - likely viral in nature Hyperglycemia without history of DM  PLAN/REC:  DC vanc Cont cefepime/azithro for now Begin methylprednisolone 02/09 SSI coverage   Merton Border, MD PCCM service Mobile [phone] Pager [phone]

## 2015-04-16 NOTE — Progress Notes (Signed)
RN spoke with Dr. Alva Garnet and made MD aware that patient's o2 sats on 55% venti mask are 84-88%.  RN asked about high flow nasal cannula. MD gave order for high flow for patient.

## 2015-04-16 NOTE — Progress Notes (Signed)
Pharmacy Consult for Electrolyte Monitoring and Replacement Indication: Hypokalemia  No Known Allergies  Patient Measurements: Height: 6' (182.9 cm) Weight: 253 lb 4.9 oz (114.9 kg) IBW/kg (Calculated) : 77.6  Vital Signs: Temp: 97.9 F (36.6 C) (02/09 0734) Temp Source: Axillary (02/09 0734) BP: 128/80 mmHg (02/09 1100) Pulse Rate: 90 (02/09 1100) Intake/Output from previous day: 02/08 0701 - 02/09 0700 In: 4445 [I.V.:3645; IV Piggyback:800] Out: 2725 [Urine:2725] Intake/Output from this shift: Total I/O In: 450 [I.V.:200; IV Piggyback:250] Out: -   Labs:  Recent Labs  04/13/15 2257 04/14/15 0426  WBC 9.4 5.2  HGB 13.6 11.1*  HCT 40.3 33.5*  PLT 217 176  APTT 30 33  INR 1.14 1.40     Recent Labs  04/13/15 2257 04/14/15 0426 04/15/15 0515 04/16/15 0413  NA 137 143 133* 136  K 3.9 3.2* 3.6 3.0*  CL 102 114* 102 102  CO2 27 25 20* 28  GLUCOSE 206* 140* 184* 203*  BUN 14 11 11 12   CREATININE 0.88 0.80 0.86 0.87  CALCIUM 8.5* 6.4* 7.3* 7.1*  MG  --  1.5* 1.9  --   PHOS  --  2.9  --   --   PROT 7.2 5.3*  --   --   ALBUMIN 4.0 2.9*  --   --   AST 50* 41  --   --   ALT 49 35  --   --   ALKPHOS 85 58  --   --   BILITOT 0.7 0.5  --   --    Estimated Creatinine Clearance: 118.1 mL/min (by C-G formula based on Cr of 0.87).    Recent Labs  04/16/15 04/16/15 0425 04/16/15 0725  GLUCAP 160* 170* 155*    Medical History: Past Medical History  Diagnosis Date  . History of prostate cancer 2004    prostatectomy 2004  . History of chicken pox   . Hypothyroid 2012    on synthroid  . Cancer (HCC)     Medications:  Scheduled:  . azithromycin  500 mg Intravenous Q24H  . ceFEPime (MAXIPIME) IV  2 g Intravenous 3 times per day  . insulin aspart  0-15 Units Subcutaneous TID WC  . insulin aspart  0-5 Units Subcutaneous QHS  . insulin aspart  4 Units Subcutaneous TID WC  . levothyroxine  50 mcg Oral QAC breakfast  . methylPREDNISolone (SOLU-MEDROL)  injection  60 mg Intravenous Q12H  . sodium chloride flush  3 mL Intravenous Q12H   Infusions:    Assessment: Pharmacy consulted to assist in monitoring and replacing electrolytes in this 60 y/o M with sepsis from PNA.   Plan:  Potassium is 3.0 and replaced with KCl 30 meq x 2. Will recheck potassium at 1800.   Ulice Dash D 04/16/2015,11:57 AM

## 2015-04-17 ENCOUNTER — Inpatient Hospital Stay: Payer: BLUE CROSS/BLUE SHIELD

## 2015-04-17 LAB — BASIC METABOLIC PANEL
ANION GAP: 8 (ref 5–15)
BUN: 15 mg/dL (ref 6–20)
CALCIUM: 7.6 mg/dL — AB (ref 8.9–10.3)
CO2: 27 mmol/L (ref 22–32)
CREATININE: 0.83 mg/dL (ref 0.61–1.24)
Chloride: 103 mmol/L (ref 101–111)
GFR calc non Af Amer: >60 mL/min (ref >60)
Glucose, Bld: 250 mg/dL — ABNORMAL HIGH (ref 65–99)
Potassium: 4 mmol/L (ref 3.5–5.1)
SODIUM: 138 mmol/L (ref 135–145)

## 2015-04-17 LAB — BLOOD GAS, ARTERIAL
Acid-Base Excess: 2.9 mmol/L (ref 0.0–3.0)
Bicarbonate: 27.9 mEq/L (ref 21.0–28.0)
FIO2: 0.95
O2 Saturation: 91.5 %
Patient temperature: 37
pCO2 arterial: 43 mmHg (ref 32.0–48.0)
pH, Arterial: 7.42 (ref 7.350–7.450)
pO2, Arterial: 61 mmHg — ABNORMAL LOW (ref 83.0–108.0)

## 2015-04-17 LAB — CBC
HCT: 43.1 % (ref 40.0–52.0)
HEMOGLOBIN: 14.3 g/dL (ref 13.0–18.0)
MCH: 28 pg (ref 26.0–34.0)
MCHC: 33.2 g/dL (ref 32.0–36.0)
MCV: 84.4 fL (ref 80.0–100.0)
PLATELETS: 165 10*3/uL (ref 150–440)
RBC: 5.1 MIL/uL (ref 4.40–5.90)
RDW: 14 % (ref 11.5–14.5)
WBC: 5 10*3/uL (ref 3.8–10.6)

## 2015-04-17 LAB — GLUCOSE, CAPILLARY
GLUCOSE-CAPILLARY: 235 mg/dL — AB (ref 65–99)
GLUCOSE-CAPILLARY: 231 mg/dL — AB (ref 65–99)
Glucose-Capillary: 228 mg/dL — ABNORMAL HIGH (ref 65–99)
Glucose-Capillary: 288 mg/dL — ABNORMAL HIGH (ref 65–99)

## 2015-04-17 LAB — MAGNESIUM: Magnesium: 2.2 mg/dL (ref 1.7–2.4)

## 2015-04-17 LAB — PROCALCITONIN: PROCALCITONIN: 0.97 ng/mL

## 2015-04-17 LAB — PHOSPHORUS: Phosphorus: 2.8 mg/dL (ref 2.5–4.6)

## 2015-04-17 MED ORDER — DEXTROSE 5 % IV SOLN
1.0000 g | INTRAVENOUS | Status: DC
  Administered 2015-04-18 – 2015-04-22 (×5): 1 g via INTRAVENOUS
  Filled 2015-04-17 (×6): qty 10

## 2015-04-17 MED ORDER — DEXTROSE 5 % IV SOLN
1.0000 g | INTRAVENOUS | Status: DC
  Administered 2015-04-17: 1 g via INTRAVENOUS
  Filled 2015-04-17: qty 10

## 2015-04-17 MED ORDER — METHYLPREDNISOLONE SODIUM SUCC 40 MG IJ SOLR
40.0000 mg | Freq: Two times a day (BID) | INTRAMUSCULAR | Status: DC
Start: 2015-04-17 — End: 2015-04-20
  Administered 2015-04-17 – 2015-04-20 (×7): 40 mg via INTRAVENOUS
  Filled 2015-04-17 (×7): qty 1

## 2015-04-17 MED ORDER — CETYLPYRIDINIUM CHLORIDE 0.05 % MT LIQD
7.0000 mL | Freq: Two times a day (BID) | OROMUCOSAL | Status: DC
  Administered 2015-04-17 – 2015-04-22 (×9): 7 mL via OROMUCOSAL

## 2015-04-17 MED ORDER — DEXTROSE 5 % IV SOLN
500.0000 mg | INTRAVENOUS | Status: DC
  Administered 2015-04-18 – 2015-04-20 (×3): 500 mg via INTRAVENOUS
  Filled 2015-04-17 (×4): qty 500

## 2015-04-17 NOTE — Progress Notes (Signed)
eLink Physician-Brief Progress Note Patient Name: Philip Lindsey DOB: 22-Aug-1955 MRN: [id number]   Date of Service  04/17/2015  HPI/Events of Note  Camera check on patient. Appears comfortable & resting. Saturation fluctuating on HFNC. Patient is breathing through his mouth some.  eICU Interventions  1. Close monitoring for possible intubation 2. Switch to Venti Mask to maintain Saturation     Intervention Category Major Interventions: Respiratory failure - evaluation and management  Tera Partridge 04/17/2015, 2:18 AM

## 2015-04-17 NOTE — Progress Notes (Signed)
ABG completed. Pt placed on NRB mask

## 2015-04-17 NOTE — Progress Notes (Signed)
eLink MD looked in on Pt. RN present in room. After assessing Pt MD gave verbal orders to switch Pt to non-rebreather. RN will continue to monitor.

## 2015-04-17 NOTE — Progress Notes (Signed)
Little change overall. Still desats easily. Does not tolerate BiPAP due to disconmfort. No distress off BiPAP. On high flow [place] O2. Cognition remains intact. No new complaints  Filed Vitals:   04/17/15 0800 04/17/15 0810 04/17/15 0900 04/17/15 1000  BP: 154/86  149/85 150/82  Pulse: 92  97 97  Temp:  98.3 F (36.8 C)    TempSrc:  Axillary    Resp: 26  21 28   Height:      Weight:      SpO2: 91%  90% 93%   HEENT WNL No JVD B crackles, no wheezes Reg, no M NABS, soft Ext warm, no edema No focal neuro deficits  BMP Latest Ref Rng 04/17/2015 04/16/2015 04/16/2015  Glucose 65 - 99 mg/dL 250(H) - 203(H)  BUN 6 - 20 mg/dL 15 - 12  Creatinine 0.61 - 1.24 mg/dL 0.83 - 0.87  Sodium 135 - 145 mmol/L 138 - 136  Potassium 3.5 - 5.1 mmol/L 4.0 4.2 3.0(L)  Chloride 101 - 111 mmol/L 103 - 102  CO2 22 - 32 mmol/L 27 - 28  Calcium 8.9 - 10.3 mg/dL 7.6(L) - 7.1(L)    CBC Latest Ref Rng 04/17/2015 04/14/2015 04/13/2015  WBC 3.8 - 10.6 K/uL 5.0 5.2 9.4  Hemoglobin 13.0 - 18.0 g/dL 14.3 11.1(L) 13.6  Hematocrit 40.0 - 52.0 % 43.1 33.5(L) 40.3  Platelets 150 - 440 K/uL 165 176 217   Results for orders placed or performed during the hospital encounter of 04/13/15  Rapid Influenza A&B Antigens ([facility] only)     Status: None   Collection Time: 04/13/15 10:57 PM  Result Value Ref Range Status   Influenza A [facility]) NOT DETECTED  Final   Influenza B ([facility]) NOT DETECTED  Final  Blood Culture (routine x 2)     Status: None (Preliminary result)   Collection Time: 04/13/15 10:58 PM  Result Value Ref Range Status   Specimen Description BLOOD LEFT ANTECUBITAL  Final   Special Requests BOTTLES DRAWN AEROBIC AND ANAEROBIC 5ML  Final   Culture NO GROWTH 4 DAYS  Final   Report Status PENDING  Incomplete  Blood Culture (routine x 2)     Status: None (Preliminary result)   Collection Time: 04/13/15 10:58 PM  Result Value Ref Range Status   Specimen Description BLOOD RIGHT ANTECUBITAL  Final   Special Requests  BOTTLES DRAWN AEROBIC AND ANAEROBIC 10ML  Final   Culture NO GROWTH 4 DAYS  Final   Report Status PENDING  Incomplete  Urine culture     Status: None   Collection Time: 04/13/15 10:58 PM  Result Value Ref Range Status   Specimen Description URINE, CLEAN CATCH  Final   Special Requests NONE  Final   Culture MULTIPLE SPECIES PRESENT, SUGGEST RECOLLECTION  Final   Report Status 04/15/2015 FINAL  Final  MRSA PCR Screening     Status: None   Collection Time: 04/15/15  6:18 PM  Result Value Ref Range Status   MRSA by PCR NEGATIVE NEGATIVE Final    Comment:        The GeneXpert MRSA Assay (FDA approved for NASAL specimens only), is one component of a comprehensive MRSA colonization surveillance program. It is not intended to diagnose MRSA infection nor to guide or monitor treatment for MRSA infections.    Anti-infectives    Start     Dose/Rate Route Frequency Ordered Stop   04/17/15 0930  cefTRIAXone (ROCEPHIN) 1 g in dextrose 5 % 50 mL IVPB  1 g 100 mL/hr over 30 Minutes Intravenous Every 24 hours 04/17/15 0916     04/16/15 0100  vancomycin (VANCOCIN) IVPB 1000 mg/200 mL premix  Status:  Discontinued     1,000 mg 200 mL/hr over 60 Minutes Intravenous Every 8 hours 04/15/15 1837 04/16/15 0935   04/15/15 1830  vancomycin (VANCOCIN) IVPB 1000 mg/200 mL premix     1,000 mg 200 mL/hr over 60 Minutes Intravenous  Once 04/15/15 1746 04/15/15 2230   04/15/15 1730  ceFEPIme (MAXIPIME) 2 g in dextrose 5 % 50 mL IVPB  Status:  Discontinued     2 g 100 mL/hr over 30 Minutes Intravenous 3 times per day 04/15/15 1717 04/17/15 0916   04/15/15 1000  cefTRIAXone (ROCEPHIN) 1 g in dextrose 5 % 50 mL IVPB  Status:  Discontinued     1 g 100 mL/hr over 30 Minutes Intravenous Every 24 hours 04/14/15 0106 04/15/15 1715   04/15/15 0900  azithromycin (ZITHROMAX) 500 mg in dextrose 5 % 250 mL IVPB     500 mg 250 mL/hr over 60 Minutes Intravenous Every 24 hours 04/14/15 0106     04/14/15 0100   piperacillin-tazobactam (ZOSYN) IVPB 3.375 g  Status:  Discontinued     3.375 g 12.5 mL/hr over 240 Minutes Intravenous STAT 04/14/15 0053 04/14/15 0058   04/14/15 0100  piperacillin-tazobactam (ZOSYN) IVPB 3.375 g     3.375 g 100 mL/hr over 30 Minutes Intravenous STAT 04/14/15 0057 04/14/15 0120   04/14/15 0045  cefTRIAXone (ROCEPHIN) 1 g in dextrose 5 % 50 mL IVPB     1 g 100 mL/hr over 30 Minutes Intravenous  Once 04/14/15 0038 04/14/15 0205   04/14/15 0045  azithromycin (ZITHROMAX) 500 mg in dextrose 5 % 250 mL IVPB     500 mg 250 mL/hr over 60 Minutes Intravenous  Once 04/14/15 0038 04/14/15 0653   04/14/15 0000  azithromycin (ZITHROMAX) 500 mg in dextrose 5 % 250 mL IVPB  Status:  Discontinued     500 mg 250 mL/hr over 60 Minutes Intravenous  Once 04/13/15 2353 04/14/15 0106   04/13/15 2315  vancomycin (VANCOCIN) IVPB 1000 mg/200 mL premix     1,000 mg 200 mL/hr over 60 Minutes Intravenous  Once 04/13/15 2313 04/14/15 0049   04/13/15 2315  piperacillin-tazobactam (ZOSYN) IVPB 3.375 g  Status:  Discontinued     3.375 g 12.5 mL/hr over 240 Minutes Intravenous  Once 04/13/15 2313 04/14/15 0052      CXR: Patchy R>L infiltrates - slightly increased 02/10  CT chest (02/09): No evidence of pulmonary embolus. 2. Diffuse bilateral lower lobe airspace opacification, with ground-glass airspace opacity, somewhat worsened from the prior study. Additional patchy airspace opacities within the remaining portions of both lungs bilaterally. This is concerning for multifocal pneumonia, possibly atypical in nature    IMPRESSION: Acute hypoxic respiratory failure Pneumonia/pneumonitis - likely viral in nature Hyperglycemia without history of DM  PLAN/REC:  Cont cefepime/azithro Cont methylprednisolone - dose reduced 02/10 Cont SSI coverage Mobilize Would watch in ICU/SDU at least one more day  Merton Border, MD PCCM service Mobile [phone] Pager [phone]

## 2015-04-17 NOTE — Progress Notes (Signed)
Pharmacy Consult for Electrolyte Monitoring and Replacement  No Known Allergies  Patient Measurements: Height: 6' (182.9 cm) Weight: 253 lb 4.9 oz (114.9 kg) IBW/kg (Calculated) : 77.6  Vital Signs: Temp: 98.3 F (36.8 C) (02/10 0810) Temp Source: Axillary (02/10 0810) BP: 150/82 mmHg (02/10 1000) Pulse Rate: 97 (02/10 1000) Intake/Output from previous day: 02/09 0701 - 02/10 0700 In: 55 [P.O.:220; I.V.:200; IV Piggyback:400] Out: 2750 [Urine:2750] Intake/Output from this shift: Total I/O In: 450 [P.O.:200; IV Piggyback:250] Out: 400 [Urine:400]  Labs:  Recent Labs  04/17/15 0557  WBC 5.0  HGB 14.3  HCT 43.1  PLT 165     Recent Labs  04/15/15 0515 04/16/15 0413 04/16/15 1853 04/17/15 0557  NA 133* 136  --  138  K 3.6 3.0* 4.2 4.0  CL 102 102  --  103  CO2 20* 28  --  27  GLUCOSE 184* 203*  --  250*  BUN 11 12  --  15  CREATININE 0.86 0.87  --  0.83  CALCIUM 7.3* 7.1*  --  7.6*  MG 1.9  --   --  2.2  PHOS  --   --   --  2.8   Estimated Creatinine Clearance: 123.8 mL/min (by C-G formula based on Cr of 0.83).    Recent Labs  04/16/15 2203 04/17/15 0741 04/17/15 1137  GLUCAP 177* 228* 288*    Medical History: Past Medical History  Diagnosis Date  . History of prostate cancer 2004    prostatectomy 2004  . History of chicken pox   . Hypothyroid 2012    on synthroid  . Cancer ([place])     Medications:  Scheduled:  . antiseptic oral rinse  7 mL Mouth Rinse BID  . [START ON 04/18/2015] azithromycin  500 mg Intravenous Q24H  . [START ON 04/18/2015] cefTRIAXone (ROCEPHIN)  IV  1 g Intravenous Q24H  . insulin aspart  0-15 Units Subcutaneous TID WC  . insulin aspart  0-5 Units Subcutaneous QHS  . insulin aspart  4 Units Subcutaneous TID WC  . levothyroxine  50 mcg Oral QAC breakfast  . methylPREDNISolone (SOLU-MEDROL) injection  40 mg Intravenous Q12H  . sodium chloride flush  3 mL Intravenous Q12H   Infusions:    Assessment: Pharmacy consulted  to assist in monitoring and replacing electrolytes in this 60 yo M with sepsis from PNA.   Plan:  Electrolytes are within normal limits. Will obtain follow-up labs with am labs on 2/11.   Pharmacy will continue to monitor and adjust per consult.   Philip Lindsey L 04/17/2015,1:08 PM

## 2015-04-17 NOTE — Progress Notes (Signed)
Pt will not wear Bipap at this time. Pt placed on HFNC for now

## 2015-04-17 NOTE — Progress Notes (Signed)
Pt O2 saturation remains low 87-97% on HFNC with periods where he dips down to low 80's. Respiratory rate fluctuates from 20's to 30's. Pt denies feeling short of breath or any discomfort. Dr. Marcille Blanco present on the unit. Informed of the situation and gave verbal orders for STAT ABG. Elink RN notified as well stated that MD informed of situation and will camera in and assess pt. Awaiting order from [place] MD.

## 2015-04-17 NOTE — Clinical Documentation Improvement (Signed)
Critical Care  Can the diagnosis of Respiratory Failure be further specified?   Document Acuity - Acute, Chronic, Acute on Chronic  Document Inclusion Of - Hypoxia, Hypercapnia, Combination of Both  Other  Clinically Undetermined  Document any associated diagnoses/conditions.   Supporting Information: Acute respiratory distress with intubation per 02/10 progress notes.   Please exercise your independent, professional judgment when responding. A specific answer is not anticipated or expected.   Thank You,  [place] [phone]

## 2015-04-17 NOTE — Progress Notes (Signed)
Called for BIPAP order. Request denied. See note from [facility]. Will continue to monitor pt.

## 2015-04-17 NOTE — Progress Notes (Signed)
[facility] at [place] NAME: Nickey Kloepfer    MR#:  [id number]  DATE OF BIRTH:  1955-09-19  SUBJECTIVE:   Patient is on high flow nasal cannula. Shortness of breath has improved. His cough is also improved.Marland Kitchen  REVIEW OF SYSTEMS:    Review of Systems  Constitutional: Positive for malaise/fatigue. Negative for chills.  HENT: Negative for sore throat.   Eyes: Negative for blurred vision.  Respiratory: Positive for shortness of breath. Negative for hemoptysis, sputum production and wheezing.   Cardiovascular: Negative for chest pain, palpitations and leg swelling.  Gastrointestinal: Negative for nausea, vomiting, abdominal pain, diarrhea and blood in stool.  Genitourinary: Negative for dysuria.  Musculoskeletal: Negative for back pain.  Neurological: Negative for dizziness, tremors, weakness and headaches.  Endo/Heme/Allergies: Does not bruise/bleed easily.    Tolerating Diet: Yes      DRUG ALLERGIES:  No Known Allergies  VITALS:  Blood pressure 154/86, pulse 92, temperature 98.3 F (36.8 C), temperature source Axillary, resp. rate 26, height 6' (1.829 m), weight 114.9 kg (253 lb 4.9 oz), SpO2 91 %.  PHYSICAL EXAMINATION:   Physical Exam  Constitutional: He is oriented to person, place, and time and well-developed, well-nourished, and in no distress. No distress.  HENT:  Head: Normocephalic.  Eyes: No scleral icterus.  Neck: Normal range of motion. Neck supple. No JVD present. No tracheal deviation present.  Cardiovascular: Normal rate, regular rhythm and normal heart sounds.  Exam reveals no gallop and no friction rub.   No murmur heard. Pulmonary/Chest: Effort normal. No respiratory distress. He has wheezes. He has rales. He exhibits no tenderness.  Abdominal: Soft. Bowel sounds are normal. He exhibits no distension and no mass. There is no tenderness. There is no rebound and no guarding.  Musculoskeletal: Normal range of  motion. He exhibits no edema.  Neurological: He is alert and oriented to person, place, and time.  Skin: Skin is warm. No rash noted. No erythema.  Psychiatric: Affect and judgment normal.      LABORATORY PANEL:   CBC  Recent Labs Lab 04/17/15 0557  WBC 5.0  HGB 14.3  HCT 43.1  PLT 165   ------------------------------------------------------------------------------------------------------------------  Chemistries   Recent Labs Lab 04/14/15 0426  04/17/15 0557  NA 143  < > 138  K 3.2*  < > 4.0  CL 114*  < > 103  CO2 25  < > 27  GLUCOSE 140*  < > 250*  BUN 11  < > 15  CREATININE 0.80  < > 0.83  CALCIUM 6.4*  < > 7.6*  MG 1.5*  < > 2.2  AST 41  --   --   ALT 35  --   --   ALKPHOS 58  --   --   BILITOT 0.5  --   --   < > = values in this interval not displayed. ------------------------------------------------------------------------------------------------------------------  Cardiac Enzymes  Recent Labs Lab 04/14/15 1847 04/16/15 0013 04/16/15 0413  TROPONINI 0.03 0.07* 0.07*   ------------------------------------------------------------------------------------------------------------------  RADIOLOGY:  Dg Chest 1 View  04/16/2015  CLINICAL DATA:  Shortness of breath . EXAM: CHEST 1 VIEW COMPARISON:  CT 04/16/2015 .  04/15/2015. FINDINGS: Mediastinum hilar structures normal. Mild cardiomegaly and pulmonary vascular prominence. Persistent multifocal pulmonary infiltrates noted most consistent with pneumonia. A component of pulmonary edema cannot be excluded. No pleural effusion or pneumothorax. IMPRESSION: 1. Persistent multifocal pulmonary infiltrates most consistent multifocal pneumonia. A component pulmonary edema cannot be excluded.  No significant interim improvement. 2. Mild cardiomegaly and mild pulmonary vascular prominence . Electronically Signed   By: Marcello Moores  Register   On: 04/16/2015 07:36   Ct Chest Wo Contrast  04/15/2015  CLINICAL DATA:  Cough, fever,  shortness of breath x1 week. History of prostate cancer. Pneumonia. EXAM: CT CHEST WITHOUT CONTRAST TECHNIQUE: Multidetector CT imaging of the chest was performed following the standard protocol without IV contrast. COMPARISON:  Chest radiograph dated 04/15/2015. FINDINGS: Mediastinum/Nodes: The heart is normal in size. No pericardial effusion. Atherosclerotic calcifications of the aortic arch. Small mediastinal lymph nodes measuring up to 9 mm short axis. Lungs/Pleura: Multifocal patchy/ ground-glass opacities throughout all lobes, bilateral lower lobe and right upper lobe predominant, suspicious for multifocal pneumonia. Underlying mild paraseptal emphysematous changes in the bilateral upper lobes. No suspicious pulmonary nodules. No pleural effusion or pneumothorax. Upper abdomen: Visualized upper abdomen is notable for moderate hepatic steatosis. Musculoskeletal: Degenerative changes of the visualized thoracolumbar spine. IMPRESSION: Multifocal pneumonia, bilateral lower lobe predominant. Electronically Signed   By: Julian Hy M.D.   On: 04/15/2015 18:25   Ct Angio Chest Pe W/cm &/or Wo Cm  04/16/2015  CLINICAL DATA:  Acute onset of sepsis and pneumonia. Decreased O2 saturation. Initial encounter. EXAM: CT ANGIOGRAPHY CHEST WITH CONTRAST TECHNIQUE: Multidetector CT imaging of the chest was performed using the standard protocol during bolus administration of intravenous contrast. Multiplanar CT image reconstructions and MIPs were obtained to evaluate the vascular anatomy. CONTRAST:  131m OMNIPAQUE IOHEXOL 350 MG/ML SOLN COMPARISON:  CT of the chest performed 04/15/2015 FINDINGS: There is no evidence of pulmonary embolus. Diffuse bilateral lower lobe airspace opacification is noted, with ground-glass airspace opacity, somewhat worsened from the prior study. Additional patchy airspace opacities are seen within the remaining portions of both lungs bilaterally. This may reflect multifocal pneumonia,  possibly atypical in nature. Certain inflammatory conditions could conceivably have a similar appearance. There is no evidence of pleural effusion or pneumothorax. No masses are identified; no abnormal focal contrast enhancement is seen. Visualized mediastinal nodes remain normal in size. No pericardial effusion is identified. The great vessels are grossly unremarkable in appearance. No axillary lymphadenopathy is seen. The visualized portions of the thyroid gland are unremarkable in appearance. The visualized portions of the liver and spleen are unremarkable. No acute osseous abnormalities are seen. Review of the MIP images confirms the above findings. IMPRESSION: 1. No evidence of pulmonary embolus. 2. Diffuse bilateral lower lobe airspace opacification, with ground-glass airspace opacity, somewhat worsened from the prior study. Additional patchy airspace opacities within the remaining portions of both lungs bilaterally. This is concerning for multifocal pneumonia, possibly atypical in nature. Certain inflammatory conditions could conceivably have a similar appearance. Electronically Signed   By: JGarald BaldingM.D.   On: 04/16/2015 01:10   Dg Chest Port 1 View  04/17/2015  CLINICAL DATA:  60year old male with bilateral pneumonia. Respiratory failure. Initial encounter. EXAM: PORTABLE CHEST 1 VIEW COMPARISON:  04/16/2015 and earlier FINDINGS: Portable AP upright view at 0600 hours. Bilateral widespread mixed interstitial and airspace opacity is stable to slightly increased since yesterday, and progressed since 04/15/2015 (particularly in the right upper lung). No pleural effusion identified. Stable cardiac size and mediastinal contours. Visualized tracheal air column is within normal limits. No pneumothorax. IMPRESSION: Radiographic progression of bilateral pneumonia since 04/15/2015, not significantly changed since yesterday. No pleural effusion identified. Electronically Signed   By: HGenevie AnnM.D.   On:  04/17/2015 07:20     ASSESSMENT AND PLAN:  24-year-old male with a history of prostate cancer, hypothyroidism who presented with sepsis from community-acquired pneumonia.  1. Sepsis: Patient met criteria by fever and tachycardia. Sepsis is from pneumonia, community-acquired.   2. Acute hypoxic respiratory failure due to severe Community-acquired pneumonia: Patient decompensated on February 8. CT scan showed bilateral lower lobe airspace opacification with groundglass airspace disease. He likely has viral pneumonia ostially superimposed on bacterial pneumonia. He was on broad-spectrum antibiotics with cefepime and vancomycin when transferred to CCU, however now transitioned back to azithromycin and ceftriaxone. CT chest was negative for pulmonary emboli. Wean IV steroids.  3. Elevated troponin: This is demand ischemia and not ACS. Echocardiogram shows normal ejection fraction without valvular abnormalities.   4. Hypokalemia: Pharmacy consult for replacement.   5. Hypocalcemia: improved.  6. Sinus tachycardia: This is due to fever and sepsis. Continue telemetry  7. Hypothyroid: Continue Synthroid Management plans discussed with the patient and he is in agreement.  CODE STATUS: FULL  Critical care TOTAL TIME TAKING CARE OF THIS PATIENT: 33 minutes.  Discussed with wife   POSSIBLE D/C ?, DEPENDING ON CLINICAL CONDITION.   Charl Wellen M.D on 04/17/2015 at 10:10 AM  Between 7am to 6pm - Pager - [phone] After 6pm go to [email or web address] - password [email or web address] [facility]  Office  [phone]  CC: Primary care physician; Ria Bush, MD  Note: This dictation was prepared with Dragon dictation along with smaller phrase technology. Any transcriptional errors that result from this process are unintentional.

## 2015-04-17 NOTE — Progress Notes (Signed)
Initial Nutrition Assessment  INTERVENTION:   Meals and Snacks: Cater to patient preferences; due to respiratory status, recommend smaller, more frequent meals to optimize po intake. Pt would like his supplement to come as a snack at this time. Encouraged pt to choose softer, easy to chew foods for ease of mastication  Medical Food Supplement Therapy: pt agreeable to supplement; pt would like to try [employer], will send TID between meals  NUTRITION DIAGNOSIS:   Inadequate oral intake related to acute illness, poor appetite as evidenced by meal completion < 50%, per patient/family report.  GOAL:   Patient will meet greater than or equal to 90% of their needs  MONITOR:    (Energy Intake, Anthropometrics, Pulmonary, Digestive System, Glucose Profile, Electrolyte/Renal Profile)  REASON FOR ASSESSMENT:   Consult    ASSESSMENT:    Pt admitted with sepsis, acute respiratory failure to pneumonia, currently on HFNC   Past Medical History  Diagnosis Date  . History of prostate cancer 2004    prostatectomy 2004  . History of chicken pox   . Hypothyroid 2012    on synthroid  . Cancer [facility])     Diet Order:  Diet Carb Modified Fluid consistency:: Thin; Room service appropriate?: Yes   Energy Intake: recorded po intake 36% on average; pt ate some oatmeal at breakfast, attempted to eat some broth for lunch but reported it tasted terrible, requesting a grilled cheese (writer called down)  Food and Nutrition Related history: reports appetite has been down since December but prior to this eating very well   Recent Labs Lab 04/14/15 0426 04/15/15 0515 04/16/15 0413 04/16/15 1853 04/17/15 0557  NA 143 133* 136  --  138  K 3.2* 3.6 3.0* 4.2 4.0  CL 114* 102 102  --  103  CO2 25 20* 28  --  27  BUN 11 11 12   --  15  CREATININE 0.80 0.86 0.87  --  0.83  CALCIUM 6.4* 7.3* 7.1*  --  7.6*  MG 1.5* 1.9  --   --  2.2  PHOS 2.9  --   --   --  2.8  GLUCOSE 140* 184*  203*  --  250*    Glucose Profile:   Recent Labs  04/16/15 2203 04/17/15 0741 04/17/15 1137  GLUCAP 177* 228* 288*   Meds: ss novolog, solumedrol  Nutrition Focused Physical Exam:  Unable to complete Nutrition-Focused physical exam at this time.    Height:   Ht Readings from Last 1 Encounters:  04/15/15 6' (1.829 m)    Weight: pt reports wt has been stable; stable as per weight encounters  Wt Readings from Last 1 Encounters:  04/15/15 253 lb 4.9 oz (114.9 kg)   Filed Weights   04/13/15 2207 04/14/15 0228 04/15/15 1900  Weight: 250 lb (113.399 kg) 254 lb 3.2 oz (115.304 kg) 253 lb 4.9 oz (114.9 kg)   Wt Readings from Last 10 Encounters:  04/15/15 253 lb 4.9 oz (114.9 kg)  04/11/11 252 lb (114.306 kg)  03/10/11 250 lb (113.399 kg)    BMI:  Body mass index is 34.35 kg/(m^2).  Estimated Nutritional Needs:   Kcal:  2182-2578 kcals (BEE 1653, 1.2 AF, 1.1-1.3 IF) Using IBW 81 kg  Protein:  89-105 g (1.1-1.3 g/kg)   Fluid:  2025-2430 mL (25-30 ml/kg)   MODERATE Care Level  Kerman Passey MS, RD, LDN [phone] Pager  [phone] Weekend/On-Call Pager

## 2015-04-18 ENCOUNTER — Inpatient Hospital Stay: Payer: BLUE CROSS/BLUE SHIELD

## 2015-04-18 LAB — CULTURE, BLOOD (ROUTINE X 2)
CULTURE: NO GROWTH
Culture: NO GROWTH

## 2015-04-18 LAB — BASIC METABOLIC PANEL
Anion gap: 10 (ref 5–15)
BUN: 22 mg/dL — AB (ref 6–20)
CALCIUM: 7.6 mg/dL — AB (ref 8.9–10.3)
CO2: 27 mmol/L (ref 22–32)
Chloride: 100 mmol/L — ABNORMAL LOW (ref 101–111)
Creatinine, Ser: 0.96 mg/dL (ref 0.61–1.24)
GFR calc Af Amer: >60 mL/min (ref >60)
GLUCOSE: 307 mg/dL — AB (ref 65–99)
Potassium: 4 mmol/L (ref 3.5–5.1)
SODIUM: 137 mmol/L (ref 135–145)

## 2015-04-18 LAB — GLUCOSE, CAPILLARY
GLUCOSE-CAPILLARY: 280 mg/dL — AB (ref 65–99)
GLUCOSE-CAPILLARY: 295 mg/dL — AB (ref 65–99)
GLUCOSE-CAPILLARY: 232 mg/dL — AB (ref 65–99)
Glucose-Capillary: 277 mg/dL — ABNORMAL HIGH (ref 65–99)

## 2015-04-18 MED ORDER — FUROSEMIDE 10 MG/ML IJ SOLN
40.0000 mg | Freq: Once | INTRAMUSCULAR | Status: AC
  Administered 2015-04-18: 40 mg via INTRAVENOUS
  Filled 2015-04-18: qty 4

## 2015-04-18 MED ORDER — SENNOSIDES-DOCUSATE SODIUM 8.6-50 MG PO TABS
1.0000 | ORAL_TABLET | Freq: Every day | ORAL | Status: DC
  Administered 2015-04-18 – 2015-04-20 (×3): 1 via ORAL
  Filled 2015-04-18 (×3): qty 1

## 2015-04-18 MED ORDER — IPRATROPIUM-ALBUTEROL 0.5-2.5 (3) MG/3ML IN SOLN
3.0000 mL | RESPIRATORY_TRACT | Status: DC
  Administered 2015-04-18 – 2015-04-22 (×26): 3 mL via RESPIRATORY_TRACT
  Filled 2015-04-18 (×26): qty 3

## 2015-04-18 MED ORDER — BUDESONIDE 0.5 MG/2ML IN SUSP
0.5000 mg | Freq: Two times a day (BID) | RESPIRATORY_TRACT | Status: DC
  Administered 2015-04-18 – 2015-04-22 (×9): 0.5 mg via RESPIRATORY_TRACT
  Filled 2015-04-18 (×9): qty 2

## 2015-04-18 NOTE — Progress Notes (Signed)
CC resp failure, resp distress HPI:  Patient tolerated biPAP last night Still remains critically ill, look ill, high flow [place] at 100% started.  Cognition remains intact. No new complaints, high risk for intubation and cardiac arrest  Filed Vitals:   04/18/15 0400 04/18/15 0500 04/18/15 0600 04/18/15 0700  BP: 129/68 121/57 114/65   Pulse: 88 99 97   Temp: 97.6 F (36.4 C)     TempSrc: Axillary     Resp: 22 23 25    Height:      Weight:      SpO2: 96% 89% 91% 90%   HEENT WNL No JVD B crackles, no wheezes Reg, no M NABS, soft Ext warm, no edema No focal neuro deficits  BMP Latest Ref Rng 04/18/2015 04/17/2015 04/16/2015  Glucose 65 - 99 mg/dL 307(H) 250(H) -  BUN 6 - 20 mg/dL 22(H) 15 -  Creatinine 0.61 - 1.24 mg/dL 0.96 0.83 -  Sodium 135 - 145 mmol/L 137 138 -  Potassium 3.5 - 5.1 mmol/L 4.0 4.0 4.2  Chloride 101 - 111 mmol/L 100(L) 103 -  CO2 22 - 32 mmol/L 27 27 -  Calcium 8.9 - 10.3 mg/dL 7.6(L) 7.6(L) -    CBC Latest Ref Rng 04/17/2015 04/14/2015 04/13/2015  WBC 3.8 - 10.6 K/uL 5.0 5.2 9.4  Hemoglobin 13.0 - 18.0 g/dL 14.3 11.1(L) 13.6  Hematocrit 40.0 - 52.0 % 43.1 33.5(L) 40.3  Platelets 150 - 440 K/uL 165 176 217   Anti-infectives    Start     Dose/Rate Route Frequency Ordered Stop   04/18/15 1000  azithromycin (ZITHROMAX) 500 mg in dextrose 5 % 250 mL IVPB     500 mg 250 mL/hr over 60 Minutes Intravenous Every 24 hours 04/17/15 1307     04/18/15 1000  cefTRIAXone (ROCEPHIN) 1 g in dextrose 5 % 50 mL IVPB     1 g 100 mL/hr over 30 Minutes Intravenous Every 24 hours 04/17/15 1307     04/17/15 0930  cefTRIAXone (ROCEPHIN) 1 g in dextrose 5 % 50 mL IVPB  Status:  Discontinued     1 g 100 mL/hr over 30 Minutes Intravenous Every 24 hours 04/17/15 0916 04/17/15 1307   04/16/15 0100  vancomycin (VANCOCIN) IVPB 1000 mg/200 mL premix  Status:  Discontinued     1,000 mg 200 mL/hr over 60 Minutes Intravenous Every 8 hours 04/15/15 1837 04/16/15 0935   04/15/15 1830   vancomycin (VANCOCIN) IVPB 1000 mg/200 mL premix     1,000 mg 200 mL/hr over 60 Minutes Intravenous  Once 04/15/15 1746 04/15/15 2230   04/15/15 1730  ceFEPIme (MAXIPIME) 2 g in dextrose 5 % 50 mL IVPB  Status:  Discontinued     2 g 100 mL/hr over 30 Minutes Intravenous 3 times per day 04/15/15 1717 04/17/15 0916   04/15/15 1000  cefTRIAXone (ROCEPHIN) 1 g in dextrose 5 % 50 mL IVPB  Status:  Discontinued     1 g 100 mL/hr over 30 Minutes Intravenous Every 24 hours 04/14/15 0106 04/15/15 1715   04/15/15 0900  azithromycin (ZITHROMAX) 500 mg in dextrose 5 % 250 mL IVPB  Status:  Discontinued     500 mg 250 mL/hr over 60 Minutes Intravenous Every 24 hours 04/14/15 0106 04/17/15 1307   04/14/15 0100  piperacillin-tazobactam (ZOSYN) IVPB 3.375 g  Status:  Discontinued     3.375 g 12.5 mL/hr over 240 Minutes Intravenous STAT 04/14/15 0053 04/14/15 0058   04/14/15 0100  piperacillin-tazobactam (ZOSYN) IVPB 3.375  g     3.375 g 100 mL/hr over 30 Minutes Intravenous STAT 04/14/15 0057 04/14/15 0120   04/14/15 0045  cefTRIAXone (ROCEPHIN) 1 g in dextrose 5 % 50 mL IVPB     1 g 100 mL/hr over 30 Minutes Intravenous  Once 04/14/15 0038 04/14/15 0205   04/14/15 0045  azithromycin (ZITHROMAX) 500 mg in dextrose 5 % 250 mL IVPB     500 mg 250 mL/hr over 60 Minutes Intravenous  Once 04/14/15 0038 04/14/15 0653   04/14/15 0000  azithromycin (ZITHROMAX) 500 mg in dextrose 5 % 250 mL IVPB  Status:  Discontinued     500 mg 250 mL/hr over 60 Minutes Intravenous  Once 04/13/15 2353 04/14/15 0106   04/13/15 2315  vancomycin (VANCOCIN) IVPB 1000 mg/200 mL premix     1,000 mg 200 mL/hr over 60 Minutes Intravenous  Once 04/13/15 2313 04/14/15 0049   04/13/15 2315  piperacillin-tazobactam (ZOSYN) IVPB 3.375 g  Status:  Discontinued     3.375 g 12.5 mL/hr over 240 Minutes Intravenous  Once 04/13/15 2313 04/14/15 0052      CXR: Patchy R>L infiltrates - slightly increased 02/10  CT chest (02/09): No evidence  of pulmonary embolus. 2. Diffuse bilateral lower lobe airspace opacification, with ground-glass airspace opacity, somewhat worsened from the prior study. Additional patchy airspace opacities within the remaining portions of both lungs bilaterally. This is concerning for multifocal pneumonia, possibly atypical in nature  ECHO 2/7 EF 55%  IMPRESSION:  60 yo white male with acute and severe hypoxic resp failure with progressive b/l Infiltrates likely from acute atypical pneumonia/pneumonitis  1.Respiratory Failure -continue biPAP and High flow intermittently as needed -start scheduled BD Therapy  2.Pneumonia -on Iv abx(rocephin and azithromycin) -will consider adding zosyn -no need for vanc at this time  3.pneumonitis -continue IV steroids    The Patient requires high complexity decision making for assessment and support, frequent evaluation and titration of therapies, application of advanced monitoring technologies and extensive interpretation of multiple databases. Critical Care Time devoted to patient care services described in this note is 45 minutes.   Overall, patient is critically ill, prognosis is guarded.  Patient is h risk for cardiac arrest and death due to his severe hypoxic resp failure.  Corrin Parker, M.D.  [facility]  Medical Director [place] Director [facility] Cardio-Pulmonary Department

## 2015-04-18 NOTE — Progress Notes (Signed)
Patient in afternoon had low O2 saturations on high flow nasal cannula at FiO2 of 95% while napping. RT gave scheduled breathing treatment and placed back on Bipap at 100% FiO2. Now unable to maintain O2 sat at goal even on Bipap. Sleeping between care, O2 sats rise when patient woken up but return to mid to low 80s when falls back asleep. Notified E Link RN about this situation and patient's earlier chest x-ray results. E Link RN to let Margaree Mackintosh MD know.

## 2015-04-18 NOTE — Progress Notes (Signed)
[facility] for Constipation prevention and treatment  No Known Allergies  Patient Measurements: Height: 6' (182.9 cm) Weight: 253 lb 4.9 oz (114.9 kg) IBW/kg (Calculated) : 77.6   Vital Signs: Temp: 99 F (37.2 C) (02/11 0800) Temp Source: Oral (02/11 0800) BP: 101/55 mmHg (02/11 1000) Pulse Rate: 87 (02/11 1000) Intake/Output from previous day: 02/10 0701 - 02/11 0700 In: 1310 [P.O.:1060; IV Piggyback:250] Out: 1425 [Urine:1425] Intake/Output from this shift: Total I/O In: 660 [P.O.:360; IV Piggyback:300] Out: -  VEstimated Creatinine Clearance: 107.1 mL/min (by C-G formula based on Cr of 0.96).   Recent Labs  04/17/15 2109 04/18/15 0746 04/18/15 1145  GLUCAP 231* 280* 295*    Assessment: 60 yo male with last reported BM on 04/13/15. Pharmacy consulted for prevention and treatment of constipation.    Plan:  Will start patient on scheduled Senna/Docusate once daily. Pharmacy will continue to follow and adjust constipation regimen as needed.   Nancy Fetter, PharmD Pharmacy Resident  04/18/2015,11:58 AM

## 2015-04-18 NOTE — Progress Notes (Signed)
Pt has agreed to wear Bipap. Pt placed on Bipap and tolerating well

## 2015-04-18 NOTE — Progress Notes (Signed)
Pharmacy Consult for Electrolyte Monitoring and Replacement  No Known Allergies  Patient Measurements: Height: 6' (182.9 cm) Weight: 253 lb 4.9 oz (114.9 kg) IBW/kg (Calculated) : 77.6  Vital Signs: Temp: 97.6 F (36.4 C) (02/11 0400) Temp Source: Axillary (02/11 0400) BP: 114/65 mmHg (02/11 0600) Pulse Rate: 97 (02/11 0600) Intake/Output from previous day: 02/10 0701 - 02/11 0700 In: 1310 [P.O.:1060; IV Piggyback:250] Out: 1425 [Urine:1425] Intake/Output from this shift:    Labs:  Recent Labs  04/17/15 0557  WBC 5.0  HGB 14.3  HCT 43.1  PLT 165     Recent Labs  04/16/15 0413 04/16/15 1853 04/17/15 0557 04/18/15 0636  NA 136  --  138 137  K 3.0* 4.2 4.0 4.0  CL 102  --  103 100*  CO2 28  --  27 27  GLUCOSE 203*  --  250* 307*  BUN 12  --  15 22*  CREATININE 0.87  --  0.83 0.96  CALCIUM 7.1*  --  7.6* 7.6*  MG  --   --  2.2  --   PHOS  --   --  2.8  --    Estimated Creatinine Clearance: 107.1 mL/min (by C-G formula based on Cr of 0.96).    Recent Labs  04/17/15 1618 04/17/15 2109 04/18/15 0746  GLUCAP 235* 231* 280*    Medical History: Past Medical History  Diagnosis Date  . History of prostate cancer 2004    prostatectomy 2004  . History of chicken pox   . Hypothyroid 2012    on synthroid  . Cancer ([place])     Medications:  Scheduled:  . antiseptic oral rinse  7 mL Mouth Rinse BID  . azithromycin  500 mg Intravenous Q24H  . cefTRIAXone (ROCEPHIN)  IV  1 g Intravenous Q24H  . insulin aspart  0-15 Units Subcutaneous TID WC  . insulin aspart  0-5 Units Subcutaneous QHS  . insulin aspart  4 Units Subcutaneous TID WC  . levothyroxine  50 mcg Oral QAC breakfast  . methylPREDNISolone (SOLU-MEDROL) injection  40 mg Intravenous Q12H  . sodium chloride flush  3 mL Intravenous Q12H   Infusions:    Assessment: Pharmacy consulted to assist in monitoring and replacing electrolytes in this 60 yo M with sepsis from PNA.   Plan:  Electrolytes  are within normal limits. Will obtain follow-up labs with am labs on 2/12.   Pharmacy will continue to monitor and adjust per consult.   Shelisha Gautier A 04/18/2015,8:36 AM

## 2015-04-18 NOTE — Progress Notes (Signed)
[facility] Progress Note Patient Name: Philip Lindsey DOB: 04/29/1955 MRN: [id number]   Date of Service  04/18/2015  HPI/Events of Note  Hypoxia - on high flow O2. Patient now back on BiPAP. CXR c/w pulmonary edema. BP = 136/72. Creatinine = 0.96.  eICU Interventions  Will order: 1. Lasix 40 mg IV now.      Intervention Category Major Interventions: Hypoxemia - evaluation and management  [name] 04/18/2015, 5:26 PM

## 2015-04-18 NOTE — Progress Notes (Signed)
Patient agreed to try and wear the Bipap.  Sitting in bed with eyes closed and family at bedside.  Tolerating well, O2 sats 90% on 100%.  Will continue to monitor.

## 2015-04-18 NOTE — Progress Notes (Signed)
Patient on HFNC for now, sats 88-92%, will drop periodically into the low 80's and sustain for about a minute when sleeping.  Patient will not wear Bipap at this time and [place] doctors do not want to use the Bipap to maintain sats.  Given verbal orders to monitor on HFNC for now.  Will continue to watch O2 sats closely and report any constant changes.

## 2015-04-18 NOTE — Progress Notes (Addendum)
[place] [facility] [place] NAME: Vanna Archambeault    MR#:  [id number]  DATE OF BIRTH:  [birth year]  SUBJECTIVE:   Continues to be on high flow nasal cannula. Has a cough but unable to completely break it up.  REVIEW OF SYSTEMS:    Review of Systems  Constitutional: Positive for malaise/fatigue. Negative for chills.  HENT: Negative for sore throat.   Eyes: Negative for blurred vision.  Respiratory: Positive for shortness of breath. Negative for hemoptysis, sputum production and wheezing.   Cardiovascular: Negative for chest pain, palpitations and leg swelling.  Gastrointestinal: Negative for nausea, vomiting, abdominal pain, diarrhea and blood in stool.  Genitourinary: Negative for dysuria.  Musculoskeletal: Negative for back pain.  Neurological: Negative for dizziness, tremors, weakness and headaches.  Endo/Heme/Allergies: Does not bruise/bleed easily.    Tolerating Diet: Yes      DRUG ALLERGIES:  No Known Allergies  VITALS:  Blood pressure 116/62, pulse 89, temperature 98.7 F (37.1 C), temperature source Oral, resp. rate 21, height 6' (1.829 m), weight 114.9 kg (253 lb 4.9 oz), SpO2 94 %.  PHYSICAL EXAMINATION:   Physical Exam  Constitutional: He is oriented to person, place, and time and well-developed, well-nourished, critically ill HENT:  Head: Normocephalic.  Eyes: No scleral icterus.  Neck: Normal range of motion. Neck supple. No JVD present. No tracheal deviation present.  Cardiovascular: Normal rate, regular rhythm and normal heart sounds.  Exam reveals no gallop and no friction rub.   No murmur heard. Pulmonary/Chest: Effort normal. No respiratory distress. He has wheezes. He has rales. He exhibits no tenderness.  Abdominal: Soft. Bowel sounds are normal. He exhibits no distension and no mass. There is no tenderness. There is no rebound and no guarding.  Musculoskeletal: Normal range of motion. He exhibits no edema.   Neurological: He is alert and oriented to person, place, and time.  Skin: Skin is warm. No rash noted. No erythema.  Psychiatric: Affect and judgment normal.      LABORATORY PANEL:   CBC  Recent Labs Lab 04/17/15 0557  WBC 5.0  HGB 14.3  HCT 43.1  PLT 165   ------------------------------------------------------------------------------------------------------------------  Chemistries   Recent Labs Lab 04/14/15 0426  04/17/15 0557 04/18/15 0636  NA 143  < > 138 137  K 3.2*  < > 4.0 4.0  CL 114*  < > 103 100*  CO2 25  < > 27 27  GLUCOSE 140*  < > 250* 307*  BUN 11  < > 15 22*  CREATININE 0.80  < > 0.83 0.96  CALCIUM 6.4*  < > 7.6* 7.6*  MG 1.5*  < > 2.2  --   AST 41  --   --   --   ALT 35  --   --   --   ALKPHOS 58  --   --   --   BILITOT 0.5  --   --   --   < > = values in this interval not displayed. ------------------------------------------------------------------------------------------------------------------  Cardiac Enzymes  Recent Labs Lab 04/14/15 1847 04/16/15 0013 04/16/15 0413  TROPONINI 0.03 0.07* 0.07*   ------------------------------------------------------------------------------------------------------------------  RADIOLOGY:  Dg Chest Port 1 View  04/18/2015  CLINICAL DATA:  Respiratory failure EXAM: PORTABLE CHEST 1 VIEW COMPARISON:  April 17, 2015 FINDINGS: There is now widespread airspace consolidation throughout the mid and lower lung zones, increased from 1 day prior. Heart is mildly enlarged with pulmonary venous hypertension. No adenopathy. IMPRESSION: Evidence  of congestive heart failure. Increase in alveolar edema bilaterally. Superimposed pneumonia cannot be excluded. Both congestive heart failure and pneumonia may exist concurrently. Electronically Signed   By: Lowella Grip III M.D.   On: 04/18/2015 08:02   Dg Chest Port 1 View  04/17/2015  CLINICAL DATA:  60 year old male with bilateral pneumonia. Respiratory failure.  Initial encounter. EXAM: PORTABLE CHEST 1 VIEW COMPARISON:  04/16/2015 and earlier FINDINGS: Portable AP upright view at 0600 hours. Bilateral widespread mixed interstitial and airspace opacity is stable to slightly increased since yesterday, and progressed since 04/15/2015 (particularly in the right upper lung). No pleural effusion identified. Stable cardiac size and mediastinal contours. Visualized tracheal air column is within normal limits. No pneumothorax. IMPRESSION: Radiographic progression of bilateral pneumonia since 04/15/2015, not significantly changed since yesterday. No pleural effusion identified. Electronically Signed   By: Genevie Ann M.D.   On: 04/17/2015 07:20     ASSESSMENT AND PLAN:   60-year-old male with a history of prostate cancer, hypothyroidism who presented with sepsis from community-acquired pneumonia.  1. Sepsis: Due to community-acquired pneumonia continue ceftriaxone and azithromycin  2. Acute hypoxic respiratory failure due to severe Community-acquired pneumonia: CT scan showed bilateral lower lobe airspace opacification with groundglass airspace disease. He was on broad-spectrum antibiotics with cefepime and vancomycin when transferred to CCU, however now transitioned back to azithromycin and ceftriaxone. CT chest was negative for pulmonary embolicontinue current therapy I will add Mucinex to current regimen   3. Elevated troponin: This is demand ischemia and not ACS. Echocardiogram shows normal ejection fraction without valvular abnormalities.  4. Hypokalemia: Pharmacy consult for replacement.   5. Hypocalcemia: improved.  6. Sinus tachycardia: This is due to fever and sepsis. Continue telemetry  7. Hypothyroid: Continue Synthroid Management plans discussed with the patient and he is in agreement.  CODE STATUS: FULL  Critical care TOTAL TIME TAKING CARE OF THIS PATIENT: 33 minutes.      D/C depends on clinical improvement   Dustin Flock M.D on  04/18/2015 at 1:47 PM  Between 7am to 6pm - Pager - [phone] After 6pm go to [email or web address] - password [email or web address] [facility]  Office  [phone]  CC: Primary care physician; Ria Bush, MD  Note: This dictation was prepared with Dragon dictation along with smaller phrase technology. Any transcriptional errors that result from this process are unintentional.

## 2015-04-19 DIAGNOSIS — J9621 Acute and chronic respiratory failure with hypoxia: Secondary | ICD-10-CM

## 2015-04-19 LAB — CBC
HEMATOCRIT: 40.4 % (ref 40.0–52.0)
HEMOGLOBIN: 13.6 g/dL (ref 13.0–18.0)
MCH: 28.1 pg (ref 26.0–34.0)
MCHC: 33.5 g/dL (ref 32.0–36.0)
MCV: 83.8 fL (ref 80.0–100.0)
Platelets: 152 10*3/uL (ref 150–440)
RBC: 4.83 MIL/uL (ref 4.40–5.90)
RDW: 14.4 % (ref 11.5–14.5)
WBC: 7.4 10*3/uL (ref 3.8–10.6)

## 2015-04-19 LAB — BASIC METABOLIC PANEL
Anion gap: 11 (ref 5–15)
BUN: 25 mg/dL — AB (ref 6–20)
CO2: 30 mmol/L (ref 22–32)
CREATININE: 0.93 mg/dL (ref 0.61–1.24)
Calcium: 7.7 mg/dL — ABNORMAL LOW (ref 8.9–10.3)
Chloride: 99 mmol/L — ABNORMAL LOW (ref 101–111)
GFR calc Af Amer: >60 mL/min (ref >60)
Glucose, Bld: 321 mg/dL — ABNORMAL HIGH (ref 65–99)
Potassium: 4 mmol/L (ref 3.5–5.1)
SODIUM: 140 mmol/L (ref 135–145)

## 2015-04-19 LAB — GLUCOSE, CAPILLARY
GLUCOSE-CAPILLARY: 304 mg/dL — AB (ref 65–99)
GLUCOSE-CAPILLARY: 232 mg/dL — AB (ref 65–99)
GLUCOSE-CAPILLARY: 186 mg/dL — AB (ref 65–99)
Glucose-Capillary: 311 mg/dL — ABNORMAL HIGH (ref 65–99)

## 2015-04-19 LAB — HEMOGLOBIN A1C: HEMOGLOBIN A1C: 7.7 % — AB (ref 4.0–6.0)

## 2015-04-19 MED ORDER — INSULIN GLARGINE 100 UNIT/ML ~~LOC~~ SOLN
8.0000 [IU] | Freq: Every day | SUBCUTANEOUS | Status: DC
  Administered 2015-04-19: 8 [IU] via SUBCUTANEOUS
  Filled 2015-04-19 (×2): qty 0.08

## 2015-04-19 MED ORDER — ACETAZOLAMIDE SODIUM 500 MG IJ SOLR
250.0000 mg | Freq: Once | INTRAMUSCULAR | Status: AC
  Administered 2015-04-19: 250 mg via INTRAVENOUS
  Filled 2015-04-19: qty 500

## 2015-04-19 NOTE — Progress Notes (Signed)
Pharmacy Consult for Electrolyte Monitoring and Replacement  No Known Allergies  Patient Measurements: Height: 6' (182.9 cm) Weight: 253 lb 4.9 oz (114.9 kg) IBW/kg (Calculated) : 77.6  Vital Signs: Temp: 98.2 F (36.8 C) (02/12 0700) Temp Source: Axillary (02/12 0700) BP: 115/66 mmHg (02/12 0800) Pulse Rate: 110 (02/12 0800) Intake/Output from previous day: 02/11 0701 - 02/12 0700 In: 1620 [P.O.:1320; IV Piggyback:300] Out: 1400 [Urine:1400] Intake/Output from this shift: Total I/O In: -  Out: 400 [Urine:400]  Labs:  Recent Labs  04/17/15 0557 04/19/15 0616  WBC 5.0 7.4  HGB 14.3 13.6  HCT 43.1 40.4  PLT 165 152     Recent Labs  04/17/15 0557 04/18/15 0636 04/19/15 0616  NA 138 137 140  K 4.0 4.0 4.0  CL 103 100* 99*  CO2 27 27 30   GLUCOSE 250* 307* 321*  BUN 15 22* 25*  CREATININE 0.83 0.96 0.93  CALCIUM 7.6* 7.6* 7.7*  MG 2.2  --   --   PHOS 2.8  --   --    Estimated Creatinine Clearance: 110.5 mL/min (by C-G formula based on Cr of 0.93).    Recent Labs  04/18/15 1702 04/18/15 2127 04/19/15 0739  GLUCAP 277* 232* 311*    Medical History: Past Medical History  Diagnosis Date  . History of prostate cancer 2004    prostatectomy 2004  . History of chicken pox   . Hypothyroid 2012    on synthroid  . Cancer ([facility])     Medications:  Scheduled:  . acetaZOLAMIDE  250 mg Intravenous Once  . antiseptic oral rinse  7 mL Mouth Rinse BID  . azithromycin  500 mg Intravenous Q24H  . budesonide (PULMICORT) nebulizer solution  0.5 mg Nebulization BID  . cefTRIAXone (ROCEPHIN)  IV  1 g Intravenous Q24H  . insulin aspart  0-15 Units Subcutaneous TID WC  . insulin aspart  0-5 Units Subcutaneous QHS  . insulin aspart  4 Units Subcutaneous TID WC  . ipratropium-albuterol  3 mL Nebulization Q4H  . levothyroxine  50 mcg Oral QAC breakfast  . methylPREDNISolone (SOLU-MEDROL) injection  40 mg Intravenous Q12H  . senna-docusate  1 tablet Oral Q1200  .  sodium chloride flush  3 mL Intravenous Q12H   Infusions:    Assessment: Pharmacy consulted to assist in monitoring and replacing electrolytes in this 60 yo M with sepsis from PNA.   Plan:  Electrolytes are within normal limits. Will obtain follow-up labs with am labs on 2/12.   2/12: Electrolytes WNL. F/u with am labs.  Pharmacy will continue to monitor and adjust per consult.   Shequilla Goodgame A 04/19/2015,8:55 AM

## 2015-04-19 NOTE — Progress Notes (Signed)
Patient sitting up on the chair, oxygen levels have improved with sitting up, o2 sats 100%. Patient continues on hi flo. VS wnl. Lines intact. Dr Mortimer Fries has made rounds and updated, family have been at bedside and updated. No ss of distress noted, will continue to monitor progress.

## 2015-04-19 NOTE — Progress Notes (Signed)
[facility] for Constipation prevention and treatment  No Known Allergies  Patient Measurements: Height: 6' (182.9 cm) Weight: 253 lb 4.9 oz (114.9 kg) IBW/kg (Calculated) : 77.6   Vital Signs: Temp: 98.2 F (36.8 C) (02/12 0700) Temp Source: Axillary (02/12 0700) BP: 115/66 mmHg (02/12 0800) Pulse Rate: 110 (02/12 0800) Intake/Output from previous day: 02/11 0701 - 02/12 0700 In: 1620 [P.O.:1320; IV Piggyback:300] Out: 1400 [Urine:1400] Intake/Output from this shift: Total I/O In: -  Out: 400 [Urine:400] VEstimated Creatinine Clearance: 110.5 mL/min (by C-G formula based on Cr of 0.93).   Recent Labs  04/18/15 1702 04/18/15 2127 04/19/15 0739  GLUCAP 277* 232* 311*    Assessment: 60 yo male with last reported BM on 04/13/15. Pharmacy consulted for prevention and treatment of constipation.    Plan:  Will start patient on scheduled Senna/Docusate once daily. Pharmacy will continue to follow and adjust constipation regimen as needed.   2/12: No BM noted yet. Continue to follow.  Chinita Greenland PharmD Clinical Pharmacist  04/19/2015,8:57 AM

## 2015-04-19 NOTE — Progress Notes (Signed)
CC resp failure, resp distress HPI:  Patient tolerated biPAP last night Still remains critically ill, look ill, high flow [place] at 100% started. I have talked with patient regarding high risk for vent support  Cognition remains intact. No new complaints, high risk for intubation and cardiac arrest  Filed Vitals:   04/19/15 0600 04/19/15 0700 04/19/15 0751 04/19/15 0800  BP: 123/62 132/73  115/66  Pulse: 91 112  110  Temp:  98.2 F (36.8 C)    TempSrc:  Axillary    Resp: 24 23  17   Height:      Weight:      SpO2: 95% 93% 92% 85%   HEENT WNL, increased WOB No JVD B crackles, no wheezes Reg, no M NABS, soft Ext warm, no edema No focal neuro deficits  BMP Latest Ref Rng 04/19/2015 04/18/2015 04/17/2015  Glucose 65 - 99 mg/dL 321(H) 307(H) 250(H)  BUN 6 - 20 mg/dL 25(H) 22(H) 15  Creatinine 0.61 - 1.24 mg/dL 0.93 0.96 0.83  Sodium 135 - 145 mmol/L 140 137 138  Potassium 3.5 - 5.1 mmol/L 4.0 4.0 4.0  Chloride 101 - 111 mmol/L 99(L) 100(L) 103  CO2 22 - 32 mmol/L 30 27 27   Calcium 8.9 - 10.3 mg/dL 7.7(L) 7.6(L) 7.6(L)    CBC Latest Ref Rng 04/19/2015 04/17/2015 04/14/2015  WBC 3.8 - 10.6 K/uL 7.4 5.0 5.2  Hemoglobin 13.0 - 18.0 g/dL 13.6 14.3 11.1(L)  Hematocrit 40.0 - 52.0 % 40.4 43.1 33.5(L)  Platelets 150 - 440 K/uL 152 165 176   Anti-infectives    Start     Dose/Rate Route Frequency Ordered Stop   04/18/15 1000  azithromycin (ZITHROMAX) 500 mg in dextrose 5 % 250 mL IVPB     500 mg 250 mL/hr over 60 Minutes Intravenous Every 24 hours 04/17/15 1307     04/18/15 1000  cefTRIAXone (ROCEPHIN) 1 g in dextrose 5 % 50 mL IVPB     1 g 100 mL/hr over 30 Minutes Intravenous Every 24 hours 04/17/15 1307     04/17/15 0930  cefTRIAXone (ROCEPHIN) 1 g in dextrose 5 % 50 mL IVPB  Status:  Discontinued     1 g 100 mL/hr over 30 Minutes Intravenous Every 24 hours 04/17/15 0916 04/17/15 1307   04/16/15 0100  vancomycin (VANCOCIN) IVPB 1000 mg/200 mL premix  Status:  Discontinued     1,000  mg 200 mL/hr over 60 Minutes Intravenous Every 8 hours 04/15/15 1837 04/16/15 0935   04/15/15 1830  vancomycin (VANCOCIN) IVPB 1000 mg/200 mL premix     1,000 mg 200 mL/hr over 60 Minutes Intravenous  Once 04/15/15 1746 04/15/15 2230   04/15/15 1730  ceFEPIme (MAXIPIME) 2 g in dextrose 5 % 50 mL IVPB  Status:  Discontinued     2 g 100 mL/hr over 30 Minutes Intravenous 3 times per day 04/15/15 1717 04/17/15 0916   04/15/15 1000  cefTRIAXone (ROCEPHIN) 1 g in dextrose 5 % 50 mL IVPB  Status:  Discontinued     1 g 100 mL/hr over 30 Minutes Intravenous Every 24 hours 04/14/15 0106 04/15/15 1715   04/15/15 0900  azithromycin (ZITHROMAX) 500 mg in dextrose 5 % 250 mL IVPB  Status:  Discontinued     500 mg 250 mL/hr over 60 Minutes Intravenous Every 24 hours 04/14/15 0106 04/17/15 1307   04/14/15 0100  piperacillin-tazobactam (ZOSYN) IVPB 3.375 g  Status:  Discontinued     3.375 g 12.5 mL/hr over 240 Minutes Intravenous STAT  04/14/15 0053 04/14/15 0058   04/14/15 0100  piperacillin-tazobactam (ZOSYN) IVPB 3.375 g     3.375 g 100 mL/hr over 30 Minutes Intravenous STAT 04/14/15 0057 04/14/15 0120   04/14/15 0045  cefTRIAXone (ROCEPHIN) 1 g in dextrose 5 % 50 mL IVPB     1 g 100 mL/hr over 30 Minutes Intravenous  Once 04/14/15 0038 04/14/15 0205   04/14/15 0045  azithromycin (ZITHROMAX) 500 mg in dextrose 5 % 250 mL IVPB     500 mg 250 mL/hr over 60 Minutes Intravenous  Once 04/14/15 0038 04/14/15 0653   04/14/15 0000  azithromycin (ZITHROMAX) 500 mg in dextrose 5 % 250 mL IVPB  Status:  Discontinued     500 mg 250 mL/hr over 60 Minutes Intravenous  Once 04/13/15 2353 04/14/15 0106   04/13/15 2315  vancomycin (VANCOCIN) IVPB 1000 mg/200 mL premix     1,000 mg 200 mL/hr over 60 Minutes Intravenous  Once 04/13/15 2313 04/14/15 0049   04/13/15 2315  piperacillin-tazobactam (ZOSYN) IVPB 3.375 g  Status:  Discontinued     3.375 g 12.5 mL/hr over 240 Minutes Intravenous  Once 04/13/15 2313 04/14/15  0052      CXR: Patchy R>L infiltrates - slightly increased 02/10  CT chest (02/09): No evidence of pulmonary embolus. 2. Diffuse bilateral lower lobe airspace opacification, with ground-glass airspace opacity, somewhat worsened from the prior study. Additional patchy airspace opacities within the remaining portions of both lungs bilaterally. This is concerning for multifocal pneumonia, possibly atypical in nature  ECHO 2/7 EF 55%  IMPRESSION:  60 yo white male with acute and severe hypoxic resp failure with progressive b/l Infiltrates likely from acute atypical pneumonia/pneumonitis  1.Respiratory Failure -continue biPAP and High flow intermittently as needed -start scheduled BD Therapy  2.Pneumonia -on Iv abx(rocephin and azithromycin) -adding zosyn -no need for vanc at this time  3.pneumonitis -continue IV steroids    The Patient requires high complexity decision making for assessment and support, frequent evaluation and titration of therapies, application of advanced monitoring technologies and extensive interpretation of multiple databases. Critical Care Time devoted to patient care services described in this note is 40 minutes.   Overall, patient is critically ill, prognosis is guarded.  Patient is h risk for cardiac arrest and death due to his severe hypoxic resp failure.  Corrin Parker, M.D.  [facility]  Medical Director [place] Director [facility] Cardio-Pulmonary Department

## 2015-04-19 NOTE — Progress Notes (Addendum)
[facility] [place] NAME: Philip Lindsey    MR#:  [id number]  DATE OF BIRTH:  13-Oct-1955  SUBJECTIVE:   Patient continues to require high flow nasal cannula no significant changes in his respiratory status  REVIEW OF SYSTEMS:    Review of Systems  Constitutional: Positive for malaise/fatigue. Negative for chills.  HENT: Negative for sore throat.   Eyes: Negative for blurred vision.  Respiratory: Positive for shortness of breath. Negative for hemoptysis, positive sputum production and wheezing.   Cardiovascular: Negative for chest pain, palpitations and leg swelling.  Gastrointestinal: Negative for nausea, vomiting, abdominal pain, diarrhea and blood in stool.  Genitourinary: Negative for dysuria.  Musculoskeletal: Negative for back pain.  Neurological: Negative for dizziness, tremors, weakness and headaches.  Endo/Heme/Allergies: Does not bruise/bleed easily.    Tolerating Diet: Yes      DRUG ALLERGIES:  No Known Allergies  VITALS:  Blood pressure 127/62, pulse 98, temperature 98.2 F (36.8 C), temperature source Axillary, resp. rate 27, height 6' (1.829 m), weight 114.9 kg (253 lb 4.9 oz), SpO2 93 %.  PHYSICAL EXAMINATION:   Physical Exam  Constitutional: He is oriented to person, place, and time and well-developed, well-nourished, critically ill HENT:  Head: Normocephalic.  Eyes: No scleral icterus.  Neck: Normal range of motion. Neck supple. No JVD present. No tracheal deviation present.  Cardiovascular: Normal rate, regular rhythm and normal heart sounds.  Exam reveals no gallop and no friction rub.   No murmur heard. Pulmonary/Chest: Effort normal. No respiratory distress. He has wheezes. He has rales. He exhibits no tenderness.  Abdominal: Soft. Bowel sounds are normal. He exhibits no distension and no mass. There is no tenderness. There is no rebound and no guarding.  Musculoskeletal: Normal range of motion. He  exhibits no edema.  Neurological: He is alert and oriented to person, place, and time.  Skin: Skin is warm. No rash noted. No erythema.  Psychiatric: Affect and judgment normal.      LABORATORY PANEL:   CBC  Recent Labs Lab 04/19/15 0616  WBC 7.4  HGB 13.6  HCT 40.4  PLT 152   ------------------------------------------------------------------------------------------------------------------  Chemistries   Recent Labs Lab 04/14/15 0426  04/17/15 0557  04/19/15 0616  NA 143  < > 138  < > 140  K 3.2*  < > 4.0  < > 4.0  CL 114*  < > 103  < > 99*  CO2 25  < > 27  < > 30  GLUCOSE 140*  < > 250*  < > 321*  BUN 11  < > 15  < > 25*  CREATININE 0.80  < > 0.83  < > 0.93  CALCIUM 6.4*  < > 7.6*  < > 7.7*  MG 1.5*  < > 2.2  --   --   AST 41  --   --   --   --   ALT 35  --   --   --   --   ALKPHOS 58  --   --   --   --   BILITOT 0.5  --   --   --   --   < > = values in this interval not displayed. ------------------------------------------------------------------------------------------------------------------  Cardiac Enzymes  Recent Labs Lab 04/14/15 1847 04/16/15 0013 04/16/15 0413  TROPONINI 0.03 0.07* 0.07*   ------------------------------------------------------------------------------------------------------------------  RADIOLOGY:  Dg Chest Port 1 View  04/18/2015  CLINICAL DATA:  Respiratory failure EXAM: PORTABLE CHEST  1 VIEW COMPARISON:  April 17, 2015 FINDINGS: There is now widespread airspace consolidation throughout the mid and lower lung zones, increased from 1 day prior. Heart is mildly enlarged with pulmonary venous hypertension. No adenopathy. IMPRESSION: Evidence of congestive heart failure. Increase in alveolar edema bilaterally. Superimposed pneumonia cannot be excluded. Both congestive heart failure and pneumonia may exist concurrently. Electronically Signed   By: Lowella Grip III M.D.   On: 04/18/2015 08:02     ASSESSMENT AND PLAN:    60-year-old male with a history of prostate cancer, hypothyroidism who presented with sepsis from community-acquired pneumonia.  1. Sepsis: Due to community-acquired pneumonia continue ceftriaxone and azithromycin Slow to improve  2. Acute hypoxic respiratory failure due to severe Community-acquired pneumonia: CT scan showed bilateral lower lobe airspace opacification with groundglass airspace disease.   3. Elevated troponin: This is demand ischemia and not ACS. Echocardiogram shows normal ejection fraction without valvular abnormalities.  4. Hypokalemia: Pharmacy consult for replacement.   5. Hypocalcemia: improved.  6. Sinus tachycardia: This is due to fever and sepsis. Continue telemetry  7. Hypothyroid: Continue Synthroid Management plans discussed with the patient and he is in agreement.   8. Hyperglycemia: start on lantus low dose CODE STATUS: FULL  Critical care TOTAL TIME TAKING CARE OF THIS PATIENT: 33 minutes.        Dustin Flock M.D on 04/19/2015 at 10:04 AM  Between 7am to 6pm - Pager - [phone] After 6pm go to [email or web address] - password [email or web address] [facility]  Office  [phone]  CC: Primary care physician; Ria Bush, MD  Note: This dictation was prepared with Dragon dictation along with smaller phrase technology. Any transcriptional errors that result from this process are unintentional.

## 2015-04-20 ENCOUNTER — Inpatient Hospital Stay: Payer: BLUE CROSS/BLUE SHIELD

## 2015-04-20 LAB — BASIC METABOLIC PANEL
ANION GAP: 8 (ref 5–15)
BUN: 27 mg/dL — ABNORMAL HIGH (ref 6–20)
CO2: 28 mmol/L (ref 22–32)
Calcium: 7.7 mg/dL — ABNORMAL LOW (ref 8.9–10.3)
Chloride: 102 mmol/L (ref 101–111)
Creatinine, Ser: 0.96 mg/dL (ref 0.61–1.24)
GFR calc Af Amer: >60 mL/min (ref >60)
GLUCOSE: 325 mg/dL — AB (ref 65–99)
POTASSIUM: 4.3 mmol/L (ref 3.5–5.1)
Sodium: 138 mmol/L (ref 135–145)

## 2015-04-20 LAB — MAGNESIUM: Magnesium: 2.5 mg/dL — ABNORMAL HIGH (ref 1.7–2.4)

## 2015-04-20 LAB — PHOSPHORUS: Phosphorus: 3.2 mg/dL (ref 2.5–4.6)

## 2015-04-20 LAB — GLUCOSE, CAPILLARY
GLUCOSE-CAPILLARY: 272 mg/dL — AB (ref 65–99)
GLUCOSE-CAPILLARY: 330 mg/dL — AB (ref 65–99)
Glucose-Capillary: 247 mg/dL — ABNORMAL HIGH (ref 65–99)
Glucose-Capillary: 254 mg/dL — ABNORMAL HIGH (ref 65–99)

## 2015-04-20 MED ORDER — INSULIN GLARGINE 100 UNIT/ML ~~LOC~~ SOLN
7.0000 [IU] | Freq: Once | SUBCUTANEOUS | Status: AC
  Administered 2015-04-20: 7 [IU] via SUBCUTANEOUS
  Filled 2015-04-20 (×3): qty 0.07

## 2015-04-20 MED ORDER — INSULIN GLARGINE 100 UNIT/ML ~~LOC~~ SOLN
15.0000 [IU] | Freq: Every day | SUBCUTANEOUS | Status: DC
  Administered 2015-04-20 – 2015-04-21 (×2): 15 [IU] via SUBCUTANEOUS
  Filled 2015-04-20 (×3): qty 0.15

## 2015-04-20 MED ORDER — AZITHROMYCIN 250 MG PO TABS
500.0000 mg | ORAL_TABLET | Freq: Every day | ORAL | Status: DC
  Administered 2015-04-21 – 2015-04-22 (×2): 500 mg via ORAL
  Filled 2015-04-20 (×2): qty 2

## 2015-04-20 MED ORDER — ENOXAPARIN SODIUM 40 MG/0.4ML ~~LOC~~ SOLN
40.0000 mg | SUBCUTANEOUS | Status: DC
  Administered 2015-04-20 – 2015-04-22 (×3): 40 mg via SUBCUTANEOUS
  Filled 2015-04-20 (×3): qty 0.4

## 2015-04-20 MED ORDER — SENNOSIDES-DOCUSATE SODIUM 8.6-50 MG PO TABS
2.0000 | ORAL_TABLET | Freq: Two times a day (BID) | ORAL | Status: DC
  Administered 2015-04-20 – 2015-04-22 (×4): 2 via ORAL
  Filled 2015-04-20 (×4): qty 2

## 2015-04-20 MED ORDER — SODIUM CHLORIDE 0.9 % IV SOLN
250.0000 mg | Freq: Four times a day (QID) | INTRAVENOUS | Status: DC
  Administered 2015-04-20 – 2015-04-22 (×10): 250 mg via INTRAVENOUS
  Filled 2015-04-20 (×13): qty 2

## 2015-04-20 NOTE — Care Management Note (Signed)
Case Management Note  Patient Details  Name: Philip Lindsey MRN: [id number] Date of Birth: 12-May-1955  Subjective/Objective:                  Met with patient and his son Roderic Palau [phone] to discuss discharge planning. Patient is currently on NRB/HF O2. He is not on O2 at home. He wants to become reestablished with [facility] in [place] [phone] as he has not been to the doctor in a while. Patient lives with his wife. He is independent with daily activities ordinarily. He drives. He has never needed home health. His wife is currently home sick too.  Action/Plan: RNCM to follow for home O2. List of home health agencies left with patient. Follow up appointment made with [facility] healthcare- as new patient- last seen 2013. Appointment scheduled for Thursday 04/30/15 at 1000AM.   Expected Discharge Date:                  Expected Discharge Plan:     In-House Referral:     Discharge planning Services     Post Acute Care Choice:  Durable Medical Equipment, Home Health Choice offered to:  Patient  DME Arranged:    DME Agency:     HH Arranged:    [place] Agency:     Status of Service:  In process, will continue to follow  Medicare Important Message Given:    Date Medicare IM Given:    Medicare IM give by:    Date Additional Medicare IM Given:    Additional Medicare Important Message give by:     If discussed at [place] of Stay Meetings, dates discussed:    Additional Comments:  Marshell Garfinkel, RN 04/20/2015, 12:29 PM

## 2015-04-20 NOTE — Progress Notes (Signed)
CC resp failure, resp distress HPI:  Patient tolerated biPAP last night, still with low sats when off bipap.  This morning wearing HFNC with NRB to maintain sats greater than 88% Still remains critically ill, look ill, high flow [place] at 100%. I have talked with patient regarding high risk for vent support  Cognition remains intact. No new complaints, high risk for intubation and cardiac arrest  Filed Vitals:   04/20/15 1300 04/20/15 1400 04/20/15 1500 04/20/15 1600  BP: 130/72 139/81 152/66 133/69  Pulse: 105 96 90 99  Temp:  97.7 F (36.5 C)    TempSrc:  Axillary    Resp: 27 26 20 23   Height:      Weight:      SpO2: 85% 88% 94% 90%   HEENT WNL, increased WOB No JVD B crackles, no wheezes Reg, no M NABS, soft Ext warm, no edema No focal neuro deficits  BMP Latest Ref Rng 04/20/2015 04/19/2015 04/18/2015  Glucose 65 - 99 mg/dL 325(H) 321(H) 307(H)  BUN 6 - 20 mg/dL 27(H) 25(H) 22(H)  Creatinine 0.61 - 1.24 mg/dL 0.96 0.93 0.96  Sodium 135 - 145 mmol/L 138 140 137  Potassium 3.5 - 5.1 mmol/L 4.3 4.0 4.0  Chloride 101 - 111 mmol/L 102 99(L) 100(L)  CO2 22 - 32 mmol/L 28 30 27   Calcium 8.9 - 10.3 mg/dL 7.7(L) 7.7(L) 7.6(L)    CBC Latest Ref Rng 04/19/2015 04/17/2015 04/14/2015  WBC 3.8 - 10.6 K/uL 7.4 5.0 5.2  Hemoglobin 13.0 - 18.0 g/dL 13.6 14.3 11.1(L)  Hematocrit 40.0 - 52.0 % 40.4 43.1 33.5(L)  Platelets 150 - 440 K/uL 152 165 176   Anti-infectives    Start     Dose/Rate Route Frequency Ordered Stop   04/18/15 1000  azithromycin (ZITHROMAX) 500 mg in dextrose 5 % 250 mL IVPB     500 mg 250 mL/hr over 60 Minutes Intravenous Every 24 hours 04/17/15 1307 04/24/15 1114   04/18/15 1000  cefTRIAXone (ROCEPHIN) 1 g in dextrose 5 % 50 mL IVPB     1 g 100 mL/hr over 30 Minutes Intravenous Every 24 hours 04/17/15 1307 04/24/15 1114   04/17/15 0930  cefTRIAXone (ROCEPHIN) 1 g in dextrose 5 % 50 mL IVPB  Status:  Discontinued     1 g 100 mL/hr over 30 Minutes Intravenous Every 24  hours 04/17/15 0916 04/17/15 1307   04/16/15 0100  vancomycin (VANCOCIN) IVPB 1000 mg/200 mL premix  Status:  Discontinued     1,000 mg 200 mL/hr over 60 Minutes Intravenous Every 8 hours 04/15/15 1837 04/16/15 0935   04/15/15 1830  vancomycin (VANCOCIN) IVPB 1000 mg/200 mL premix     1,000 mg 200 mL/hr over 60 Minutes Intravenous  Once 04/15/15 1746 04/15/15 2230   04/15/15 1730  ceFEPIme (MAXIPIME) 2 g in dextrose 5 % 50 mL IVPB  Status:  Discontinued     2 g 100 mL/hr over 30 Minutes Intravenous 3 times per day 04/15/15 1717 04/17/15 0916   04/15/15 1000  cefTRIAXone (ROCEPHIN) 1 g in dextrose 5 % 50 mL IVPB  Status:  Discontinued     1 g 100 mL/hr over 30 Minutes Intravenous Every 24 hours 04/14/15 0106 04/15/15 1715   04/15/15 0900  azithromycin (ZITHROMAX) 500 mg in dextrose 5 % 250 mL IVPB  Status:  Discontinued     500 mg 250 mL/hr over 60 Minutes Intravenous Every 24 hours 04/14/15 0106 04/17/15 1307   04/14/15 0100  piperacillin-tazobactam (ZOSYN) IVPB  3.375 g  Status:  Discontinued     3.375 g 12.5 mL/hr over 240 Minutes Intravenous STAT 04/14/15 0053 04/14/15 0058   04/14/15 0100  piperacillin-tazobactam (ZOSYN) IVPB 3.375 g     3.375 g 100 mL/hr over 30 Minutes Intravenous STAT 04/14/15 0057 04/14/15 0120   04/14/15 0045  cefTRIAXone (ROCEPHIN) 1 g in dextrose 5 % 50 mL IVPB     1 g 100 mL/hr over 30 Minutes Intravenous  Once 04/14/15 0038 04/14/15 0205   04/14/15 0045  azithromycin (ZITHROMAX) 500 mg in dextrose 5 % 250 mL IVPB     500 mg 250 mL/hr over 60 Minutes Intravenous  Once 04/14/15 0038 04/14/15 0653   04/14/15 0000  azithromycin (ZITHROMAX) 500 mg in dextrose 5 % 250 mL IVPB  Status:  Discontinued     500 mg 250 mL/hr over 60 Minutes Intravenous  Once 04/13/15 2353 04/14/15 0106   04/13/15 2315  vancomycin (VANCOCIN) IVPB 1000 mg/200 mL premix     1,000 mg 200 mL/hr over 60 Minutes Intravenous  Once 04/13/15 2313 04/14/15 0049   04/13/15 2315   piperacillin-tazobactam (ZOSYN) IVPB 3.375 g  Status:  Discontinued     3.375 g 12.5 mL/hr over 240 Minutes Intravenous  Once 04/13/15 2313 04/14/15 0052      CXR: Patchy R>L infiltrates - slightly increased 02/10  CT chest (02/09): No evidence of pulmonary embolus. 2. Diffuse bilateral lower lobe airspace opacification, with ground-glass airspace opacity, somewhat worsened from the prior study. Additional patchy airspace opacities within the remaining portions of both lungs bilaterally. This is concerning for multifocal pneumonia, possibly atypical in nature  ECHO 2/7 EF 55%  IMPRESSION:  60 yo white male with acute and severe hypoxic resp failure with progressive b/l Infiltrates likely from acute atypical pneumonia/pneumonitis  1.Respiratory Failure - -continue biPAP and High flow intermittently as needed -scheduled BD Therapy - possible AIP, CT chest reviewed, temporal association with symptoms.  Will give high dose steroids, 1g x 3 days, and reasses. If not improvement, may need to consider intubation with biopsy.   2.Pneumonia -on Iv abx(rocephin and azithromycin) - zosyn -no need for vanc at this time  3.pneumonitis - possible AIP -continue IV steroids - as stated above.     The Patient requires high complexity decision making for assessment and support, frequent evaluation and titration of therapies, application of advanced monitoring technologies and extensive interpretation of multiple databases. Critical Care Time devoted to patient care services described in this note is 45 minutes.   Overall, patient is critically ill, prognosis is guarded.  Patient is h risk for cardiac arrest and death due to his severe hypoxic resp failure.  Vilinda Boehringer, MD [facility] Pager [phone] (please enter 7-digits) On Call Pager - [phone] (please enter 7-digits) Clinic - [phone]

## 2015-04-20 NOTE — Progress Notes (Signed)
Pt between Bipap and High flow/Non-Rebx, for main of sats, pt became sleepy and placed on Bi-pap, pt currently tol Bi-pap well, nursing will cont to monitor

## 2015-04-20 NOTE — Progress Notes (Signed)
Inpatient Diabetes Program Recommendations  AACE/ADA: New Consensus Statement on Inpatient Glycemic Control (2015)  Target Ranges:  Prepandial:   less than 140 mg/dL      Peak postprandial:   less than 180 mg/dL (1-2 hours)      Critically ill patients:  140 - 180 mg/dL  Results for Philip Lindsey, Philip Lindsey (MRN [id number]) as of 04/20/2015 09:32  Ref. Range 04/19/2015 07:39 04/19/2015 11:35 04/19/2015 16:16 04/19/2015 21:45 04/20/2015 07:52  Glucose-Capillary Latest Ref Range: 65-99 mg/dL 311 (H) Novolog 15 units (SSI+MC) 304 (H) Novolog 15 units (SSI+MC) 232 (H) Novolog 9 units (SSI+MC) 186 (H)  272 (H) Novolog 12 units (SSI+MC)  Results for [name], [name] (MRN [id number]) as of 04/20/2015 09:32  Ref. Range 04/19/2015 06:16  Hemoglobin A1C Latest Ref Range: 4.0-6.0 % 7.7 (H)   Review of Glycemic Control  Diabetes history: No Outpatient Diabetes medications: NA Current orders for Inpatient glycemic control: Lantus 8 units QHS, Novolog 4 units TID with meals for meal coverage, Novolog 0-15 units TID with meals, Novolog 0-5 units QHS  Inpatient Diabetes Program Recommendations: Insulin - Basal: If steroids will be continued as ordered (Solumedrol 40 mg Q12H), please consider increasing Lantus to 12 units QHS. Insulin - Meal Coverage: If steroids will be continued as ordered (Solumedrol 40 mg Q12H), please consider increasing meal coverage to Novolog 7 units TID with meals. HgbA1C: A1C is 7.7% on 04/19/2015 which meets ADA criteria for DM dx. MD, please indicate if patient will be dx with DM at this time. If patient will be dx with DM, please inform patient and NURSING so that patient can be educated on diabetes.  Thanks, Barnie Alderman, RN, MSN, CDE Diabetes Coordinator Inpatient Diabetes Program [phone] (Team Pager from [place] to [place]) [phone] ([facility] office) [phone] [facility] office) [phone] [facility] office)

## 2015-04-20 NOTE — Progress Notes (Signed)
[place] NOTE  Pharmacy Consult for electrolyte/constipation management     No Known Allergies  Patient Measurements: Height: 6' (182.9 cm) Weight: 253 lb 4.9 oz (114.9 kg) IBW/kg (Calculated) : 77.6   Vital Signs: Temp: 97.7 F (36.5 C) (02/13 1400) Temp Source: Axillary (02/13 1400) BP: 140/83 mmHg (02/13 1800) Pulse Rate: 94 (02/13 1800) Intake/Output from previous day: 02/12 0701 - 02/13 0700 In: 920 [P.O.:620; IV Piggyback:300] Out: [id number] [Urine:1665] Intake/Output from this shift: Total I/O In: 1124 [P.O.:240; Other:480; IV Piggyback:404] Out: 1375 [Urine:1375] Vent settings for last 24 hours: Vent Mode:  [-]  FiO2 (%):  [95 %-100 %] 100 %  Labs:  Recent Labs  04/18/15 0636 04/19/15 0616 04/20/15 0710  WBC  --  7.4  --   HGB  --  13.6  --   HCT  --  40.4  --   PLT  --  152  --   CREATININE 0.96 0.93 0.96  MG  --   --  2.5*  PHOS  --   --  3.2   Estimated Creatinine Clearance: 107.1 mL/min (by C-G formula based on Cr of 0.96).   Recent Labs  04/20/15 0752 04/20/15 1127 04/20/15 1633  GLUCAP 272* 330* 247*    Microbiology: Recent Results (from the past 720 hour(s))  Rapid Influenza A&B Antigens ([facility] only)     Status: None   Collection Time: 04/13/15 10:57 PM  Result Value Ref Range Status   Influenza A [facility]) NOT DETECTED  Final   Influenza B ([facility]) NOT DETECTED  Final  Blood Culture (routine x 2)     Status: None   Collection Time: 04/13/15 10:58 PM  Result Value Ref Range Status   Specimen Description BLOOD LEFT ANTECUBITAL  Final   Special Requests BOTTLES DRAWN AEROBIC AND ANAEROBIC 5ML  Final   Culture NO GROWTH 5 DAYS  Final   Report Status 04/18/2015 FINAL  Final  Blood Culture (routine x 2)     Status: None   Collection Time: 04/13/15 10:58 PM  Result Value Ref Range Status   Specimen Description BLOOD RIGHT ANTECUBITAL  Final   Special Requests BOTTLES DRAWN AEROBIC AND ANAEROBIC 10ML  Final   Culture NO  GROWTH 5 DAYS  Final   Report Status 04/18/2015 FINAL  Final  Urine culture     Status: None   Collection Time: 04/13/15 10:58 PM  Result Value Ref Range Status   Specimen Description URINE, CLEAN CATCH  Final   Special Requests NONE  Final   Culture MULTIPLE SPECIES PRESENT, SUGGEST RECOLLECTION  Final   Report Status 04/15/2015 FINAL  Final  MRSA PCR Screening     Status: None   Collection Time: 04/15/15  6:18 PM  Result Value Ref Range Status   MRSA by PCR NEGATIVE NEGATIVE Final    Comment:        The GeneXpert MRSA Assay (FDA approved for NASAL specimens only), is one component of a comprehensive MRSA colonization surveillance program. It is not intended to diagnose MRSA infection nor to guide or monitor treatment for MRSA infections.     Medications:  Scheduled:  . antiseptic oral rinse  7 mL Mouth Rinse BID  . [START ON 04/21/2015] azithromycin  500 mg Oral Daily  . budesonide (PULMICORT) nebulizer solution  0.5 mg Nebulization BID  . cefTRIAXone (ROCEPHIN)  IV  1 g Intravenous Q24H  . enoxaparin (LOVENOX) injection  40 mg Subcutaneous Q24H  . insulin aspart  0-15 Units Subcutaneous TID WC  . insulin aspart  0-5 Units Subcutaneous QHS  . insulin aspart  4 Units Subcutaneous TID WC  . insulin glargine  15 Units Subcutaneous QHS  . ipratropium-albuterol  3 mL Nebulization Q4H  . levothyroxine  50 mcg Oral QAC breakfast  . methylPREDNISolone (SOLU-MEDROL) injection  250 mg Intravenous Q6H  . senna-docusate  2 tablet Oral BID  . sodium chloride flush  3 mL Intravenous Q12H   Infusions:    Assessment: Pharmacy consulted for constipation and electrolyte management for 60 yo male ICU patient. Patient's last documented bowel movement is 2/8.   Plan:  1. Electrolytes: no replacement warranted at this time. Will obtain follow-up electrolytes daily while patient is in ICU.   2. Constipation: no documented bowel movement. Will transition patient to senna/docusate 2 tabs  BID.   Pharmacy will continue to monitor and adjust per protocol.    Laina Guerrieri L 04/20/2015,6:47 PM

## 2015-04-20 NOTE — Progress Notes (Signed)
[place] [facility] [place] NAME: Philip Lindsey    MR#:  [id number]  DATE OF BIRTH:  01/21/1956  SUBJECTIVE:   Patient continues to require high flow oxygen. No significant improvement in his respiratory status.  REVIEW OF SYSTEMS:    Review of Systems  Constitutional: Positive for malaise/fatigue. Negative for chills.  HENT: Negative for sore throat.   Eyes: Negative for blurred vision.  Respiratory: Positive for shortness of breath. Negative for hemoptysis, positive sputum production and wheezing.   Cardiovascular: Negative for chest pain, palpitations and leg swelling.  Gastrointestinal: Negative for nausea, vomiting, abdominal pain, diarrhea and blood in stool.  Genitourinary: Negative for dysuria.  Musculoskeletal: Negative for back pain.  Neurological: Negative for dizziness, tremors, weakness and headaches.  Endo/Heme/Allergies: Does not bruise/bleed easily.    Tolerating Diet: Yes      DRUG ALLERGIES:  No Known Allergies  VITALS:  Blood pressure 121/61, pulse 120, temperature 97.5 F (36.4 C), temperature source Axillary, resp. rate 31, height 6' (1.829 m), weight 114.9 kg (253 lb 4.9 oz), SpO2 96 %.  PHYSICAL EXAMINATION:   Physical Exam  Constitutional: He is oriented to person, place, and time and well-developed, well-nourished, critically ill HENT:  Head: Normocephalic.  Eyes: No scleral icterus.  Neck: Normal range of motion. Neck supple. No JVD present. No tracheal deviation present.  Cardiovascular: Normal rate, regular rhythm and normal heart sounds.  Exam reveals no gallop and no friction rub.   No murmur heard. Pulmonary/Chest: Effort normal. No respiratory distress.Rhonchus breath sounds bilaterally  He exhibits no tenderness.  Abdominal: Soft. Bowel sounds are normal. He exhibits no distension and no mass. There is no tenderness. There is no rebound and no guarding.  Musculoskeletal: Normal range of motion.  He exhibits no edema.  Neurological: He is alert and oriented to person, place, and time.  Skin: Skin is warm. No rash noted. No erythema.  Psychiatric: Affect and judgment normal.      LABORATORY PANEL:   CBC  Recent Labs Lab 04/19/15 0616  WBC 7.4  HGB 13.6  HCT 40.4  PLT 152   ------------------------------------------------------------------------------------------------------------------  Chemistries   Recent Labs Lab 04/14/15 0426  04/20/15 0710  NA 143  < > 138  K 3.2*  < > 4.3  CL 114*  < > 102  CO2 25  < > 28  GLUCOSE 140*  < > 325*  BUN 11  < > 27*  CREATININE 0.80  < > 0.96  CALCIUM 6.4*  < > 7.7*  MG 1.5*  < > 2.5*  AST 41  --   --   ALT 35  --   --   ALKPHOS 58  --   --   BILITOT 0.5  --   --   < > = values in this interval not displayed. ------------------------------------------------------------------------------------------------------------------  Cardiac Enzymes  Recent Labs Lab 04/14/15 1847 04/16/15 0013 04/16/15 0413  TROPONINI 0.03 0.07* 0.07*   ------------------------------------------------------------------------------------------------------------------  RADIOLOGY:  Dg Abd 1 View  04/20/2015  CLINICAL DATA:  Constipation. Prostate cancer status post prostatectomy. EXAM: ABDOMEN - 1 VIEW COMPARISON:  None. FINDINGS: Nonobstructive bowel gas pattern. Normal colonic stool burden. Surgical clips related to prior prostatectomy with pelvic lymph node dissection. Mild degenerative changes of the lower lumbar spine. IMPRESSION: Unremarkable abdominal radiograph. Electronically Signed   By: Julian Hy M.D.   On: 04/20/2015 12:29     ASSESSMENT AND PLAN:   60-year-old male with a history  of prostate cancer, hypothyroidism who presented with sepsis from community-acquired pneumonia.  1. Sepsis: Due to community-acquired pneumonia continue ceftriaxone and azithromycin Slow to improve  2. Acute hypoxic respiratory failure due to  severe Community-acquired pneumonia: CT scan showed bilateral lower lobe airspace opacification with groundglass airspace disease.Due to failure to improve ARDS is a possibility. Pulmonary following   3. Elevated troponin: This is demand ischemia and not ACS. Echocardiogram shows normal ejection fraction without valvular abnormalities.  4. Hypokalemia: Pharmacy consult for replacement.   5. Hypocalcemia: improved.  6. Sinus tachycardia: This is due to fever and sepsis. Continue telemetry  7. Hypothyroid: Continue Synthroid Management plans discussed with the patient and he is in agreement.   8. Hyperglycemia: continue lantus and ssi CODE STATUS: FULL  Critical care TOTAL TIME TAKING CARE OF THIS PATIENT: 33 minutes.        Dustin Flock M.D on 04/20/2015 at 1:43 PM  Between 7am to 6pm - Pager - [phone] After 6pm go to [email or web address] - password [email or web address] [facility]  Office  [phone]  CC: Primary care physician; Ria Bush, MD  Note: This dictation was prepared with Dragon dictation along with smaller phrase technology. Any transcriptional errors that result from this process are unintentional.

## 2015-04-21 ENCOUNTER — Inpatient Hospital Stay: Payer: BLUE CROSS/BLUE SHIELD

## 2015-04-21 LAB — GLUCOSE, CAPILLARY
GLUCOSE-CAPILLARY: 259 mg/dL — AB (ref 65–99)
GLUCOSE-CAPILLARY: 209 mg/dL — AB (ref 65–99)
Glucose-Capillary: 324 mg/dL — ABNORMAL HIGH (ref 65–99)
Glucose-Capillary: 312 mg/dL — ABNORMAL HIGH (ref 65–99)

## 2015-04-21 LAB — BASIC METABOLIC PANEL
Anion gap: 11 (ref 5–15)
BUN: 25 mg/dL — ABNORMAL HIGH (ref 6–20)
CO2: 28 mmol/L (ref 22–32)
Calcium: 7.6 mg/dL — ABNORMAL LOW (ref 8.9–10.3)
Chloride: 96 mmol/L — ABNORMAL LOW (ref 101–111)
Creatinine, Ser: 0.83 mg/dL (ref 0.61–1.24)
GFR calc Af Amer: >60 mL/min (ref >60)
GFR calc non Af Amer: >60 mL/min (ref >60)
Glucose, Bld: 341 mg/dL — ABNORMAL HIGH (ref 65–99)
Potassium: 3.2 mmol/L — ABNORMAL LOW (ref 3.5–5.1)
Sodium: 135 mmol/L (ref 135–145)

## 2015-04-21 LAB — CBC
HEMATOCRIT: 44.3 % (ref 40.0–52.0)
Hemoglobin: 14.6 g/dL (ref 13.0–18.0)
MCH: 27.8 pg (ref 26.0–34.0)
MCHC: 33 g/dL (ref 32.0–36.0)
MCV: 84.4 fL (ref 80.0–100.0)
PLATELETS: 175 10*3/uL (ref 150–440)
RBC: 5.25 MIL/uL (ref 4.40–5.90)
RDW: 14.5 % (ref 11.5–14.5)
WBC: 8.8 10*3/uL (ref 3.8–10.6)

## 2015-04-21 LAB — MAGNESIUM: Magnesium: 2.6 mg/dL — ABNORMAL HIGH (ref 1.7–2.4)

## 2015-04-21 LAB — MISC LABCORP TEST (SEND OUT): LABCORP TEST CODE: 9985

## 2015-04-21 LAB — PHOSPHORUS: Phosphorus: 2.7 mg/dL (ref 2.5–4.6)

## 2015-04-21 MED ORDER — POTASSIUM CHLORIDE CRYS ER 20 MEQ PO TBCR
40.0000 meq | EXTENDED_RELEASE_TABLET | Freq: Once | ORAL | Status: AC
  Administered 2015-04-21: 40 meq via ORAL
  Filled 2015-04-21: qty 2

## 2015-04-21 MED ORDER — INSULIN ASPART 100 UNIT/ML ~~LOC~~ SOLN
0.0000 [IU] | SUBCUTANEOUS | Status: DC
  Administered 2015-04-21: 15 [IU] via SUBCUTANEOUS
  Administered 2015-04-21: 11 [IU] via SUBCUTANEOUS
  Administered 2015-04-21: 7 [IU] via SUBCUTANEOUS
  Administered 2015-04-22: 11 [IU] via SUBCUTANEOUS
  Administered 2015-04-22: 7 [IU] via SUBCUTANEOUS
  Administered 2015-04-22: 15 [IU] via SUBCUTANEOUS
  Administered 2015-04-22: 11 [IU] via SUBCUTANEOUS
  Filled 2015-04-21: qty 7
  Filled 2015-04-21 (×2): qty 11
  Filled 2015-04-21: qty 15
  Filled 2015-04-21: qty 7

## 2015-04-21 NOTE — Progress Notes (Signed)
[place] NOTE  Pharmacy Consult for electrolyte/constipation management     No Known Allergies  Patient Measurements: Height: 6' (182.9 cm) Weight: 239 lb 3.2 oz (108.5 kg) IBW/kg (Calculated) : 77.6   Vital Signs: Temp: 97.8 F (36.6 C) (02/14 1900) Temp Source: Oral (02/14 1900) BP: 143/103 mmHg (02/14 2200) Pulse Rate: 86 (02/14 2200) Intake/Output from previous day: 02/13 0701 - 02/14 0700 In: 1324 [P.O.:440; IV Piggyback:404] Out: 2255 [Urine:2255] Intake/Output from this shift:   Vent settings for last 24 hours: Vent Mode:  [-]  FiO2 (%):  [95.5 %-96.8 %] 95.5 %  Labs:  Recent Labs  04/19/15 0616 04/20/15 0710 04/21/15 0625  WBC 7.4  --  8.8  HGB 13.6  --  14.6  HCT 40.4  --  44.3  PLT 152  --  175  CREATININE 0.93 0.96 0.83  MG  --  2.5* 2.6*  PHOS  --  3.2 2.7   Estimated Creatinine Clearance: 120.5 mL/min (by C-G formula based on Cr of 0.83).   Recent Labs  04/21/15 1134 04/21/15 1627 04/21/15 2009  GLUCAP 312* 259* 209*    Microbiology: Recent Results (from the past 720 hour(s))  Rapid Influenza A&B Antigens ([facility] only)     Status: None   Collection Time: 04/13/15 10:57 PM  Result Value Ref Range Status   Influenza A [facility]) NOT DETECTED  Final   Influenza B ([facility]) NOT DETECTED  Final  Blood Culture (routine x 2)     Status: None   Collection Time: 04/13/15 10:58 PM  Result Value Ref Range Status   Specimen Description BLOOD LEFT ANTECUBITAL  Final   Special Requests BOTTLES DRAWN AEROBIC AND ANAEROBIC 5ML  Final   Culture NO GROWTH 5 DAYS  Final   Report Status 04/18/2015 FINAL  Final  Blood Culture (routine x 2)     Status: None   Collection Time: 04/13/15 10:58 PM  Result Value Ref Range Status   Specimen Description BLOOD RIGHT ANTECUBITAL  Final   Special Requests BOTTLES DRAWN AEROBIC AND ANAEROBIC 10ML  Final   Culture NO GROWTH 5 DAYS  Final   Report Status 04/18/2015 FINAL  Final  Urine culture      Status: None   Collection Time: 04/13/15 10:58 PM  Result Value Ref Range Status   Specimen Description URINE, CLEAN CATCH  Final   Special Requests NONE  Final   Culture MULTIPLE SPECIES PRESENT, SUGGEST RECOLLECTION  Final   Report Status 04/15/2015 FINAL  Final  MRSA PCR Screening     Status: None   Collection Time: 04/15/15  6:18 PM  Result Value Ref Range Status   MRSA by PCR NEGATIVE NEGATIVE Final    Comment:        The GeneXpert MRSA Assay (FDA approved for NASAL specimens only), is one component of a comprehensive MRSA colonization surveillance program. It is not intended to diagnose MRSA infection nor to guide or monitor treatment for MRSA infections.     Medications:  Scheduled:  . antiseptic oral rinse  7 mL Mouth Rinse BID  . azithromycin  500 mg Oral Daily  . budesonide (PULMICORT) nebulizer solution  0.5 mg Nebulization BID  . cefTRIAXone (ROCEPHIN)  IV  1 g Intravenous Q24H  . enoxaparin (LOVENOX) injection  40 mg Subcutaneous Q24H  . insulin aspart  0-20 Units Subcutaneous 6 times per day  . insulin aspart  4 Units Subcutaneous TID WC  . insulin glargine  15 Units  Subcutaneous QHS  . ipratropium-albuterol  3 mL Nebulization Q4H  . levothyroxine  50 mcg Oral QAC breakfast  . methylPREDNISolone (SOLU-MEDROL) injection  250 mg Intravenous Q6H  . senna-docusate  2 tablet Oral BID  . sodium chloride flush  3 mL Intravenous Q12H   Infusions:    Assessment: Pharmacy consulted for constipation and electrolyte management for 60 yo male ICU patient. Patient's last documented bowel movement is 2/8.   Plan:  1. Electrolytes: Patient received potassium 11mEq x 2 on 2/14. No further supplementation warranted. Will obtain follow-up electrolytes daily while patient is in ICU.   2. Constipation: no documented bowel movement. Will continue patient to senna/docusate 2 tabs BID. Further adjustments to be discussed on rounds on 2/15.   Pharmacy will continue to monitor  and adjust per protocol.    Philip Lindsey,Philip Lindsey 04/21/2015,10:34 PM

## 2015-04-21 NOTE — Plan of Care (Signed)
Problem: Safety: Goal: Ability to remain free from injury will improve Outcome: Progressing Bedrest maintained.  Pt has no respiratory reserve for activity.  Problem: Pain Managment: Goal: General experience of comfort will improve Outcome: Progressing Tylenol for general aches x1 today  Problem: Fluid Volume: Goal: Ability to maintain a balanced intake and output will improve Outcome: Progressing Oral intake has been carnation milk shake and sips H2O.  Urine output decreased but adequate.  Problem: Education: Goal: Knowledge of disease or condition will improve Outcome: Progressing Pt and family have spoke at length with Dr Genevive Bi and Dr Stevenson Clinch.  They are aware of seriousness of inflammatory process in lungs, high dose steroid therapy, potential for intubation and possibility of transfer to [facility], [place], or [facility] for possible lung biopsy and ECMO.  A triple lumen PICC line was inserted left upper arm due to decreased sites available, long term IV therapy and possible emergent access needed.

## 2015-04-21 NOTE — Progress Notes (Signed)
[place] [facility] [place] NAME: Philip Lindsey    MR#:  [id number]  DATE OF BIRTH:  06-Aug-1955  SUBJECTIVE:   Patient still very short of breath on high flow oxygen  REVIEW OF SYSTEMS:    Review of Systems  Constitutional: Positive for malaise/fatigue. Negative for chills.  HENT: Negative for sore throat.   Eyes: Negative for blurred vision.  Respiratory: Positive for shortness of breath. Negative for hemoptysis, positive sputum production and wheezing.   Cardiovascular: Negative for chest pain, palpitations and leg swelling.  Gastrointestinal: Negative for nausea, vomiting, abdominal pain, diarrhea and blood in stool.  Genitourinary: Negative for dysuria.  Musculoskeletal: Negative for back pain.  Neurological: Negative for dizziness, tremors, weakness and headaches.  Endo/Heme/Allergies: Does not bruise/bleed easily.    Tolerating Diet: Yes      DRUG ALLERGIES:  No Known Allergies  VITALS:  Blood pressure 152/68, pulse 97, temperature 97.9 F (36.6 C), temperature source Oral, resp. rate 27, height 6' (1.829 m), weight 108.5 kg (239 lb 3.2 oz), SpO2 89 %.  PHYSICAL EXAMINATION:   Physical Exam  Constitutional: He is oriented to person, place, and time and well-developed, well-nourished, critically ill HENT:  Head: Normocephalic.  Eyes: No scleral icterus.  Neck: Normal range of motion. Neck supple. No JVD present. No tracheal deviation present.  Cardiovascular: Normal rate, regular rhythm and normal heart sounds.  Exam reveals no gallop and no friction rub.   No murmur heard. Pulmonary/Chest: Effort normal. No respiratory distress.Rhonchus breath sounds bilaterally  He exhibits no tenderness.  Abdominal: Soft. Bowel sounds are normal. He exhibits no distension and no mass. There is no tenderness. There is no rebound and no guarding.  Musculoskeletal: Normal range of motion. He exhibits no edema.  Neurological: He is alert  and oriented to person, place, and time.  Skin: Skin is warm. No rash noted. No erythema.  Psychiatric: Affect and judgment normal.      LABORATORY PANEL:   CBC  Recent Labs Lab 04/21/15 0625  WBC 8.8  HGB 14.6  HCT 44.3  PLT 175   ------------------------------------------------------------------------------------------------------------------  Chemistries   Recent Labs Lab 04/21/15 0625  NA 135  K 3.2*  CL 96*  CO2 28  GLUCOSE 341*  BUN 25*  CREATININE 0.83  CALCIUM 7.6*  MG 2.6*   ------------------------------------------------------------------------------------------------------------------  Cardiac Enzymes  Recent Labs Lab 04/14/15 1847 04/16/15 0013 04/16/15 0413  TROPONINI 0.03 0.07* 0.07*   ------------------------------------------------------------------------------------------------------------------  RADIOLOGY:  Dg Abd 1 View  04/20/2015  CLINICAL DATA:  Constipation. Prostate cancer status post prostatectomy. EXAM: ABDOMEN - 1 VIEW COMPARISON:  None. FINDINGS: Nonobstructive bowel gas pattern. Normal colonic stool burden. Surgical clips related to prior prostatectomy with pelvic lymph node dissection. Mild degenerative changes of the lower lumbar spine. IMPRESSION: Unremarkable abdominal radiograph. Electronically Signed   By: Julian Hy M.D.   On: 04/20/2015 12:29   Dg Chest Port 1 View  04/21/2015  CLINICAL DATA:  60-year-old male with acute respiratory failure. Followup pneumonia. EXAM: PORTABLE CHEST 1 VIEW COMPARISON:  04/18/2015 and earlier, including CTA chest 04/16/2015 and CT chest 04/15/2015. FINDINGS: Cardiac silhouette normal in size for AP portable technique, unchanged. Patchy airspace opacities throughout both lungs, associated with scattered air bronchograms, waxing and waning. Improved aeration in the right upper lobe and both lower lobes since the most recent prior examination, but worsening aeration in the left upper lobe. No  new areas of airspace opacity. No visible pleural effusions. IMPRESSION: Patchy  airspace opacities throughout both lungs, waxing and waning over the past 4-5 days. Query atypical pneumonia versus acute inflammatory lung disease versus ARDS. No new areas of disease in either lung. Electronically Signed   By: Evangeline Dakin M.D.   On: 04/21/2015 11:01     ASSESSMENT AND PLAN:   60-year-old male with a history of prostate cancer, hypothyroidism who presented with sepsis from community-acquired pneumonia.  1. Sepsis: Due to pneumonia pneumonia continue ceftriaxone and azithromycin Slow to improve  2. Acute hypoxic respiratory failure due to severe Community-acquired pneumonia  Chest x-ray consistent with possible ARDS Acute interstital pneumonia Patient may need lung biopsy and a transfer this has been discussed with the intensivist. Patient will likely need intubation prior to being transferred.   3. Elevated troponin: This is demand ischemia and not ACS. Echocardiogram shows normal ejection fraction without valvular abnormalities.  4. Hypokalemia: Pharmacy consult for replacement.   5. Hypocalcemia: improved.  6. Sinus tachycardia: This is due to fever and sepsis. Continue telemetry  7. Hypothyroid: Continue Synthroid Management plans discussed with the patient and he is in agreement.   8. Hyperglycemia: continue lantus and ssi CODE STATUS: FULL  Critical care TOTAL TIME TAKING CARE OF THIS PATIENT: 33 minutes.        Dustin Flock M.D on 04/21/2015 at 2:01 PM  Between 7am to 6pm - Pager - [phone] After 6pm go to [email or web address] - password [email or web address] [facility]  Office  [phone]  CC: Primary care physician; Ria Bush, MD  Note: This dictation was prepared with Dragon dictation along with smaller phrase technology. Any transcriptional errors that result from this process are unintentional.

## 2015-04-21 NOTE — Progress Notes (Signed)
Patient ID: Philip Lindsey, male   DOB: Sep 29, 1955, 60 y.o.   MRN: [id number]  Chief Complaint  Patient presents with  . Cough  . Fever    Referred By Dr. Edwin Dada Reason for Referral open lung biopsy  HPI Location, Quality, Duration, Severity, Timing, Context, Modifying Factors, Associated Signs and Symptoms.  Philip Lindsey is a 60 y.o. male.  I have personally seen and examined this patient. I discussed this care in detail with Dr. Edwin Dada. Of also discussed his care with his wife. He is a 60 year old male who lives with his wife who was recently diagnosed with the flu. He himself has been ill for the last several days and was admitted to the hospital for 5 days ago with progressive shortness of breath. He is noticed that he's been more more short of breath over the last few days and his oxygen saturations have been steadily declining despite increasing oxygen requirements. I was asked to consider an open lung biopsy. Dr. Edwin Dada started the patient on high-dose steroids for the last 24 hours. He and I had a long discussion about the possibility of him being intubated and what that may mean for his ultimate care.   Past Medical History  Diagnosis Date  . History of prostate cancer 2004    prostatectomy 2004  . History of chicken pox   . Hypothyroid 2012    on synthroid  . Cancer [facility])     Past Surgical History  Procedure Laterality Date  . Knee arthroscopy w/ acl reconstruction  2001  . Prostatectomy  2004  . Colonoscopy  05/2014    normal, rpt 43yrs due to fam hx Philip Lindsey)    Family History  Problem Relation Age of Onset  . Diabetes Mother   . Coronary artery disease Neg Hx   . Stroke Neg Hx   . Hypertension Neg Hx   . Hyperlipidemia Neg Hx   . Cancer Neg Hx     Social History Social History  Substance Use Topics  . Smoking status: Former Smoker -- 0.50 packs/day for 10 years    Quit date: 03/07/2005  . Smokeless tobacco: Never Used  . Alcohol Use: No    No Known  Allergies  Current Facility-Administered Medications  Medication Dose Route Frequency Provider Last Rate Last Dose  . acetaminophen (TYLENOL) tablet 650 mg  650 mg Oral Q6H PRN Saundra Shelling, MD   650 mg at 04/21/15 1204  . antiseptic oral rinse (CPC / CETYLPYRIDINIUM CHLORIDE 0.05%) solution 7 mL  7 mL Mouth Rinse BID Sital Mody, MD   7 mL at 04/21/15 1000  . azithromycin (ZITHROMAX) tablet 500 mg  500 mg Oral Daily Charlett Nose, RPH   500 mg at 04/21/15 1014  . budesonide (PULMICORT) nebulizer solution 0.5 mg  0.5 mg Nebulization BID Flora Lipps, MD   0.5 mg at 04/21/15 0807  . cefTRIAXone (ROCEPHIN) 1 g in dextrose 5 % 50 mL IVPB  1 g Intravenous Q24H Vishal Mungal, MD   1 g at 04/21/15 1100  . enoxaparin (LOVENOX) injection 40 mg  40 mg Subcutaneous Q24H Vishal Mungal, MD   40 mg at 04/21/15 1156  . HYDROcodone-acetaminophen (NORCO/VICODIN) 5-325 MG per tablet 1-2 tablet  1-2 tablet Oral Q4H PRN Pavan Pyreddy, MD      . insulin aspart (novoLOG) injection 0-20 Units  0-20 Units Subcutaneous 6 times per day Vilinda Boehringer, MD   11 Units at 04/21/15 1636  . insulin aspart (novoLOG) injection 4  Units  4 Units Subcutaneous TID WC Wilhelmina Mcardle, MD   4 Units at 04/21/15 1636  . insulin glargine (LANTUS) injection 15 Units  15 Units Subcutaneous QHS Vilinda Boehringer, MD   15 Units at 04/20/15 2142  . ipratropium-albuterol (DUONEB) 0.5-2.5 (3) MG/3ML nebulizer solution 3 mL  3 mL Nebulization Q4H Flora Lipps, MD   3 mL at 04/21/15 1640  . levothyroxine (SYNTHROID, LEVOTHROID) tablet 50 mcg  50 mcg Oral QAC breakfast Saundra Shelling, MD   50 mcg at 04/21/15 0801  . methylPREDNISolone sodium succinate (SOLU-MEDROL) 250 mg in sodium chloride 0.9 % 50 mL IVPB  250 mg Intravenous Q6H Vishal Mungal, MD   250 mg at 04/21/15 1635  . ondansetron (ZOFRAN) injection 4 mg  4 mg Intravenous Q6H PRN Saundra Shelling, MD      . senna-docusate (Senokot-S) tablet 2 tablet  2 tablet Oral BID Charlett Nose, RPH   2  tablet at 04/21/15 1014  . sodium chloride flush (NS) 0.9 % injection 3 mL  3 mL Intravenous Q12H Saundra Shelling, MD   3 mL at 04/20/15 2117      Review of Systems A complete review of systems was asked and was negative except for the following positive findings increasing shortness of breath.  No hemoptysis.  Blood pressure 148/75, pulse 95, temperature 98.1 F (36.7 C), temperature source Oral, resp. rate 28, height 6' (1.829 m), weight 239 lb 3.2 oz (108.5 kg), SpO2 88 %.  Physical Exam CONSTITUTIONAL:  Pleasant, well-developed, well-nourished, in obvious respiratory distress. EYES: Pupils equal and reactive to light, Sclera non-icteric EARS, NOSE, MOUTH AND THROAT:  The oropharynx was clear.  LYMPH NODES:  Lymph nodes in the neck and axillae were normal RESPIRATORY:  His lungs were diminished bilaterally. There was some use of accessory muscles of respiration. CARDIOVASCULAR: Heart was regular without murmurs.  There were no carotid bruits. GI: The abdomen was soft, nontender, and nondistended. There were no palpable masses. There was no hepatosplenomegaly. There were normal bowel sounds in all quadrants. GU:  Rectal deferred.   MUSCULOSKELETAL:  Normal muscle strength and tone.  No clubbing or cyanosis.   SKIN:  There were no pathologic skin lesions.  There were no nodules on palpation.    Data Reviewed CT scans  I have personally reviewed the patient's imaging, laboratory findings and medical records.    Assessment    I believe this patient has an acute interstitial process.    Plan    Dr. Stevenson Clinch and I spent considerable time talking about the possibility of him requiring ECMO in the near future. Because of that I did not recommend an open lung biopsy as this point as it will not add anything to his current treatment with high-dose steroids.  I reviewed this with the patient and his wife as well. It is my recommendation that if additional oxygen requirements are needed that  he be transferred to an institution with ECMO services.       Nestor Lewandowsky, MD 04/21/2015, 4:54 PM

## 2015-04-21 NOTE — Progress Notes (Signed)
Inpatient Diabetes Program Recommendations  AACE/ADA: New Consensus Statement on Inpatient Glycemic Control (2015)  Target Ranges:  Prepandial:   less than 140 mg/dL      Peak postprandial:   less than 180 mg/dL (1-2 hours)      Critically ill patients:  140 - 180 mg/dL  Results for VIRDEN, MCLAFFERTY (MRN [id number]) as of 04/21/2015 08:07  Ref. Range 04/20/2015 07:52 04/20/2015 11:27 04/20/2015 16:33 04/20/2015 21:30 04/21/2015 07:45  Glucose-Capillary Latest Ref Range: 65-99 mg/dL 272 (H) 330 (H) 247 (H) 254 (H) 324 (H)   Review of Glycemic Control  Diabetes history: No Outpatient Diabetes medications: NA Current orders for Inpatient glycemic control: Lantus 15 units QHS, Novolog 4 units TID with meals for meal coverage, Novolog 0-15 units TID with meals, Novolog 0-5 units QHS   Inpatient Diabetes Program Recommendations: Insulin - Basal: If steroids are continued as ordered (Solumedrol 250 mg Q6H), please consider increasing Lantus to 25 units QHS. Correction (SSI): Please consider increasing Novolog correction to Resistant scale and if patient is not eating please consider changing frequency to Q4H. HgbA1C: A1C is 7.7% on 04/19/2015 which meets ADA criteria for DM dx. MD, please indicate if patient will be dx with DM. If patient willl be dx with DM, please inform patient and NURSING so that patient can be educated on diabetes.  Thanks, Barnie Alderman, RN, MSN, CDE Diabetes Coordinator Inpatient Diabetes Program [phone] (Team Pager from [place] to [place]) [phone] ([facility] office) [phone] [facility] office) [phone] [facility] office)

## 2015-04-21 NOTE — Progress Notes (Signed)
CC resp failure, resp distress HPI:  This morning wearing HFNC with NRB to maintain sats greater than 88% Still remains critically ill,on high flow [place] at 100% along with NRB.  He is fully awake and mentating.  I have talked with patient regarding high risk for vent support, also updated the patient's wife.   Cognition remains intact. No new complaints, high risk for intubation and cardiac arrest  Filed Vitals:   04/21/15 1000 04/21/15 1100 04/21/15 1200 04/21/15 1300  BP: 141/73 145/72 135/85 152/68  Pulse: 105 109 103 97  Temp:  97.9 F (36.6 C)    TempSrc:  Oral    Resp: 24 27 26 27   Height:      Weight:      SpO2: 85% 86% 89% 89%   HEENT WNL, increased WOB No JVD B crackles, no wheezes Reg, no M NABS, soft Ext warm, no edema No focal neuro deficits  BMP Latest Ref Rng 04/21/2015 04/20/2015 04/19/2015  Glucose 65 - 99 mg/dL 341(H) 325(H) 321(H)  BUN 6 - 20 mg/dL 25(H) 27(H) 25(H)  Creatinine 0.61 - 1.24 mg/dL 0.83 0.96 0.93  Sodium 135 - 145 mmol/L 135 138 140  Potassium 3.5 - 5.1 mmol/L 3.2(L) 4.3 4.0  Chloride 101 - 111 mmol/L 96(L) 102 99(L)  CO2 22 - 32 mmol/L 28 28 30   Calcium 8.9 - 10.3 mg/dL 7.6(L) 7.7(L) 7.7(L)    CBC Latest Ref Rng 04/21/2015 04/19/2015 04/17/2015  WBC 3.8 - 10.6 K/uL 8.8 7.4 5.0  Hemoglobin 13.0 - 18.0 g/dL 14.6 13.6 14.3  Hematocrit 40.0 - 52.0 % 44.3 40.4 43.1  Platelets 150 - 440 K/uL 175 152 165   Anti-infectives    Start     Dose/Rate Route Frequency Ordered Stop   04/21/15 1000  azithromycin (ZITHROMAX) tablet 500 mg     500 mg Oral Daily 04/20/15 1847     04/18/15 1000  azithromycin (ZITHROMAX) 500 mg in dextrose 5 % 250 mL IVPB  Status:  Discontinued     500 mg 250 mL/hr over 60 Minutes Intravenous Every 24 hours 04/17/15 1307 04/20/15 1847   04/18/15 1000  cefTRIAXone (ROCEPHIN) 1 g in dextrose 5 % 50 mL IVPB     1 g 100 mL/hr over 30 Minutes Intravenous Every 24 hours 04/17/15 1307 04/24/15 1114   04/17/15 0930  cefTRIAXone  (ROCEPHIN) 1 g in dextrose 5 % 50 mL IVPB  Status:  Discontinued     1 g 100 mL/hr over 30 Minutes Intravenous Every 24 hours 04/17/15 0916 04/17/15 1307   04/16/15 0100  vancomycin (VANCOCIN) IVPB 1000 mg/200 mL premix  Status:  Discontinued     1,000 mg 200 mL/hr over 60 Minutes Intravenous Every 8 hours 04/15/15 1837 04/16/15 0935   04/15/15 1830  vancomycin (VANCOCIN) IVPB 1000 mg/200 mL premix     1,000 mg 200 mL/hr over 60 Minutes Intravenous  Once 04/15/15 1746 04/15/15 2230   04/15/15 1730  ceFEPIme (MAXIPIME) 2 g in dextrose 5 % 50 mL IVPB  Status:  Discontinued     2 g 100 mL/hr over 30 Minutes Intravenous 3 times per day 04/15/15 1717 04/17/15 0916   04/15/15 1000  cefTRIAXone (ROCEPHIN) 1 g in dextrose 5 % 50 mL IVPB  Status:  Discontinued     1 g 100 mL/hr over 30 Minutes Intravenous Every 24 hours 04/14/15 0106 04/15/15 1715   04/15/15 0900  azithromycin (ZITHROMAX) 500 mg in dextrose 5 % 250 mL IVPB  Status:  Discontinued     500 mg 250 mL/hr over 60 Minutes Intravenous Every 24 hours 04/14/15 0106 04/17/15 1307   04/14/15 0100  piperacillin-tazobactam (ZOSYN) IVPB 3.375 g  Status:  Discontinued     3.375 g 12.5 mL/hr over 240 Minutes Intravenous STAT 04/14/15 0053 04/14/15 0058   04/14/15 0100  piperacillin-tazobactam (ZOSYN) IVPB 3.375 g     3.375 g 100 mL/hr over 30 Minutes Intravenous STAT 04/14/15 0057 04/14/15 0120   04/14/15 0045  cefTRIAXone (ROCEPHIN) 1 g in dextrose 5 % 50 mL IVPB     1 g 100 mL/hr over 30 Minutes Intravenous  Once 04/14/15 0038 04/14/15 0205   04/14/15 0045  azithromycin (ZITHROMAX) 500 mg in dextrose 5 % 250 mL IVPB     500 mg 250 mL/hr over 60 Minutes Intravenous  Once 04/14/15 0038 04/14/15 0653   04/14/15 0000  azithromycin (ZITHROMAX) 500 mg in dextrose 5 % 250 mL IVPB  Status:  Discontinued     500 mg 250 mL/hr over 60 Minutes Intravenous  Once 04/13/15 2353 04/14/15 0106   04/13/15 2315  vancomycin (VANCOCIN) IVPB 1000 mg/200 mL  premix     1,000 mg 200 mL/hr over 60 Minutes Intravenous  Once 04/13/15 2313 04/14/15 0049   04/13/15 2315  piperacillin-tazobactam (ZOSYN) IVPB 3.375 g  Status:  Discontinued     3.375 g 12.5 mL/hr over 240 Minutes Intravenous  Once 04/13/15 2313 04/14/15 0052      CXR: Patchy R>L infiltrates - slightly increased 02/10  CT chest (02/09): No evidence of pulmonary embolus. 2. Diffuse bilateral lower lobe airspace opacification, with ground-glass airspace opacity, somewhat worsened from the prior study. Additional patchy airspace opacities within the remaining portions of both lungs bilaterally. This is concerning for multifocal pneumonia, possibly atypical in nature  ECHO 2/7 EF 55%  IMPRESSION:  60 yo white male with acute and severe hypoxic resp failure with progressive b/l Infiltrates likely from acute atypical pneumonia/pneumonitis  1.Respiratory Failure - -continue biPAP and High flow intermittently as needed -scheduled BD Therapy - possible AIP, CT chest reviewed, temporal association with symptoms.  Will give high dose steroids, 1g x 3 days (last dose 2/15), and reasses. If no improvement, may need to consider intubation with biopsy. Currently unable to wean FiO2 less than 100%, intubating patient may be a difficult wean, if he is intubated and will require lung biopsy will consider transfer to tertiary care center with ECMO services.   2.Pneumonia -on Iv abx(rocephin and azithromycin) - zosyn -no need for vanc at this time  3.pneumonitis - possible AIP -continue IV steroids - as stated above.     The Patient requires high complexity decision making for assessment and support, frequent evaluation and titration of therapies, application of advanced monitoring technologies and extensive interpretation of multiple databases. Critical Care Time devoted to patient care services described in this note is 35 minutes.   Overall, patient is critically ill, prognosis is guarded.   Patient is h risk for cardiac arrest and death due to his severe hypoxic resp failure.  Vilinda Boehringer, MD [facility] Pager [phone] (please enter 7-digits) On Call Pager - [phone] (please enter 7-digits) Clinic - [phone]

## 2015-04-21 NOTE — Progress Notes (Signed)
Pastoral Care and Prayer provided for patient.

## 2015-04-21 NOTE — Progress Notes (Signed)
Pt bet. High flow and bi-pap. Now pt is on [facility] withNRB. sats in bet. 85-89%. High flow [place] at 100%. VSS. Family at bed side.no s/s of distress noted.will continue to observe closely.

## 2015-04-22 ENCOUNTER — Inpatient Hospital Stay: Payer: BLUE CROSS/BLUE SHIELD

## 2015-04-22 DIAGNOSIS — J849 Interstitial pulmonary disease, unspecified: Secondary | ICD-10-CM

## 2015-04-22 LAB — BLOOD GAS, ARTERIAL
ACID-BASE EXCESS: 4.9 mmol/L — AB (ref 0.0–3.0)
Allens test (pass/fail): POSITIVE — AB
BICARBONATE: 28.1 meq/L — AB (ref 21.0–28.0)
FIO2: 1
O2 Saturation: 89.1 %
PCO2 ART: 36 mmHg (ref 32.0–48.0)
PH ART: 7.5 — AB (ref 7.350–7.450)
PO2 ART: 51 mmHg — AB (ref 83.0–108.0)
Patient temperature: 37

## 2015-04-22 LAB — BASIC METABOLIC PANEL
Anion gap: 7 (ref 5–15)
Anion gap: 5 (ref 5–15)
BUN: 28 mg/dL — ABNORMAL HIGH (ref 6–20)
BUN: 27 mg/dL — AB (ref 6–20)
CALCIUM: 7.6 mg/dL — AB (ref 8.9–10.3)
CHLORIDE: 107 mmol/L (ref 101–111)
CHLORIDE: 106 mmol/L (ref 101–111)
CO2: 29 mmol/L (ref 22–32)
CO2: 32 mmol/L (ref 22–32)
CREATININE: 0.75 mg/dL (ref 0.61–1.24)
CREATININE: 0.77 mg/dL (ref 0.61–1.24)
Calcium: 7.6 mg/dL — ABNORMAL LOW (ref 8.9–10.3)
GFR calc non Af Amer: >60 mL/min (ref >60)
GLUCOSE: 276 mg/dL — AB (ref 65–99)
Glucose, Bld: 275 mg/dL — ABNORMAL HIGH (ref 65–99)
Potassium: 3.9 mmol/L (ref 3.5–5.1)
Potassium: 4.1 mmol/L (ref 3.5–5.1)
SODIUM: 143 mmol/L (ref 135–145)
Sodium: 143 mmol/L (ref 135–145)

## 2015-04-22 LAB — GLUCOSE, CAPILLARY
GLUCOSE-CAPILLARY: 252 mg/dL — AB (ref 65–99)
GLUCOSE-CAPILLARY: 261 mg/dL — AB (ref 65–99)
GLUCOSE-CAPILLARY: 294 mg/dL — AB (ref 65–99)
GLUCOSE-CAPILLARY: 314 mg/dL — AB (ref 65–99)
Glucose-Capillary: 249 mg/dL — ABNORMAL HIGH (ref 65–99)
Glucose-Capillary: 329 mg/dL — ABNORMAL HIGH (ref 65–99)
Glucose-Capillary: 276 mg/dL — ABNORMAL HIGH (ref 65–99)

## 2015-04-22 MED ORDER — FENTANYL 2500MCG IN NS 250ML (10MCG/ML) PREMIX INFUSION: 25.0000 ug/h | INTRAVENOUS | Status: DC

## 2015-04-22 MED ORDER — POTASSIUM CHLORIDE 20 MEQ PO PACK: 40.0000 meq | PACK | Freq: Once | ORAL | Status: DC

## 2015-04-22 MED ORDER — LACTULOSE 10 GM/15ML PO SOLN
30.0000 g | Freq: Once | ORAL | Status: AC
  Administered 2015-04-22: 30 g via ORAL
  Filled 2015-04-22: qty 60

## 2015-04-22 MED ORDER — VECURONIUM BROMIDE 10 MG IV SOLR
10.0000 mg | Freq: Once | INTRAVENOUS | Status: AC
Start: 2015-04-22 — End: 2015-04-22

## 2015-04-22 MED ORDER — VECURONIUM BROMIDE 10 MG IV SOLR
INTRAVENOUS | Status: AC
  Administered 2015-04-22: 10 mg
  Filled 2015-04-22: qty 10

## 2015-04-22 MED ORDER — SODIUM CHLORIDE 0.9 % IV SOLN
25.0000 ug/h | INTRAVENOUS | Status: DC
  Filled 2015-04-22: qty 50

## 2015-04-22 MED ORDER — FENTANYL CITRATE (PF) 100 MCG/2ML IJ SOLN
100.0000 ug | Freq: Once | INTRAMUSCULAR | Status: AC
  Administered 2015-04-22: 100 ug via INTRAVENOUS

## 2015-04-22 MED ORDER — MIDAZOLAM HCL 2 MG/2ML IJ SOLN
4.0000 mg | Freq: Once | INTRAMUSCULAR | Status: AC
  Administered 2015-04-22: 4 mg via INTRAVENOUS

## 2015-04-22 MED ORDER — DEXTROSE-NACL 5-0.45 % IV SOLN
INTRAVENOUS | Status: DC
Start: 2015-04-22 — End: 2015-04-22

## 2015-04-22 MED ORDER — FENTANYL 2500MCG IN NS 250ML (10MCG/ML) PREMIX INFUSION
25.0000 ug/h | INTRAVENOUS | Status: DC
  Administered 2015-04-22: 50 ug/h via INTRAVENOUS

## 2015-04-22 MED ORDER — FENTANYL CITRATE (PF) 100 MCG/2ML IJ SOLN
100.0000 ug | INTRAMUSCULAR | Status: DC | PRN
  Filled 2015-04-22: qty 2

## 2015-04-22 MED ORDER — PROPOFOL 1000 MG/100ML IV EMUL
0.0000 ug/kg/min | INTRAVENOUS | Status: DC
  Administered 2015-04-22: 20 ug/kg/min via INTRAVENOUS
  Filled 2015-04-22: qty 100

## 2015-04-22 MED ORDER — INSULIN GLARGINE 100 UNIT/ML ~~LOC~~ SOLN
20.0000 [IU] | Freq: Every day | SUBCUTANEOUS | Status: DC
  Filled 2015-04-22: qty 0.2

## 2015-04-22 MED ORDER — FENTANYL CITRATE (PF) 100 MCG/2ML IJ SOLN
100.0000 ug | INTRAMUSCULAR | Status: DC | PRN
  Administered 2015-04-22: 100 ug via INTRAVENOUS
  Filled 2015-04-22: qty 2

## 2015-04-22 MED ORDER — SODIUM CHLORIDE 0.9 % IV SOLN
INTRAVENOUS | Status: DC
  Administered 2015-04-22: 2.5 [IU]/h via INTRAVENOUS
  Filled 2015-04-22: qty 2.5

## 2015-04-22 MED ORDER — SODIUM CHLORIDE 0.9 % IV SOLN: INTRAVENOUS | Status: DC

## 2015-04-22 MED ORDER — INSULIN REGULAR BOLUS VIA INFUSION
0.0000 [IU] | Freq: Three times a day (TID) | INTRAVENOUS | Status: DC
  Filled 2015-04-22: qty 10

## 2015-04-22 MED ORDER — FENTANYL CITRATE (PF) 100 MCG/2ML IJ SOLN
INTRAMUSCULAR | Status: AC
  Administered 2015-04-22: 100 ug via INTRAVENOUS
  Filled 2015-04-22: qty 4

## 2015-04-22 MED ORDER — DEXTROSE 50 % IV SOLN: 25.0000 mL | INTRAVENOUS | Status: DC | PRN

## 2015-04-22 MED ORDER — MIDAZOLAM HCL 2 MG/2ML IJ SOLN
2.0000 mg | Freq: Once | INTRAMUSCULAR | Status: AC
  Administered 2015-04-22: 2 mg via INTRAVENOUS

## 2015-04-22 MED ORDER — FENTANYL CITRATE (PF) 100 MCG/2ML IJ SOLN
INTRAMUSCULAR | Status: AC
  Administered 2015-04-22: 100 ug via INTRAVENOUS
  Filled 2015-04-22: qty 2

## 2015-04-22 MED ORDER — MIDAZOLAM HCL 2 MG/2ML IJ SOLN
INTRAMUSCULAR | Status: AC
Start: 2015-04-22 — End: 2015-04-22
  Filled 2015-04-22: qty 2

## 2015-04-22 MED ORDER — FUROSEMIDE 10 MG/ML IJ SOLN
20.0000 mg | Freq: Two times a day (BID) | INTRAMUSCULAR | Status: DC
  Administered 2015-04-22: 20 mg via INTRAVENOUS
  Filled 2015-04-22: qty 2

## 2015-04-22 MED ORDER — MIDAZOLAM HCL 2 MG/2ML IJ SOLN
INTRAMUSCULAR | Status: AC
  Filled 2015-04-22: qty 4

## 2015-04-22 MED ORDER — FENTANYL 2500MCG IN NS 250ML (10MCG/ML) PREMIX INFUSION
20.0000 ug/h | INTRAVENOUS | Status: DC
  Filled 2015-04-22: qty 250

## 2015-04-22 NOTE — Progress Notes (Signed)
Nutrition Follow-up    INTERVENTION:   Meals and Snacks: recommend liberalizing diet to regular but downgrading consistency to mechanical soft for ease to chewing, decrease work of eating Medical Food Supplement Therapy: continue [employer] Breakfast between meals, add on meal trays as well. Will send Magic Cup as well Coordination of Care: if pt intubated and not able to transfer same day, recommend initiation of Adult Tube Feeding Protocol  NUTRITION DIAGNOSIS:   Inadequate oral intake related to acute illness, poor appetite as evidenced by meal completion < 50%, per patient/family report.  GOAL:   Patient will meet greater than or equal to 90% of their needs  MONITOR:    (Energy Intake, Anthropometrics, Pulmonary, Digestive System, Glucose Profile, Electrolyte/Renal Profile)  REASON FOR ASSESSMENT:   Consult    ASSESSMENT:   Pt requiring HFNC and non-rebreather mask, does not want to be on Bipap; noted plan for transfer when bed available, possible intubation prior to transfer. Pt very fatigued on visit today. Pt on high dose solumedrol, on insulin drip for hyperglycemia  Diet Order:  Carb Modified  Energy Intake: pt has essentially been eating 0% of meal trays, drinking [employer] Breakfast supplements. Pt reports he likes soft foods like pudding and ice cream  Digestive System: constipated, MD is aware; no N/V   Recent Labs Lab 04/17/15 0557  04/20/15 0710 04/21/15 0625 04/22/15 0549 04/22/15 0817  NA 138  < > 138 135 143 143  K 4.0  < > 4.3 3.2* 3.9 4.1  CL 103  < > 102 96* 107 106  CO2 27  < > 28 28 29  32  BUN 15  < > 27* 25* 28* 27*  CREATININE 0.83  < > 0.96 0.83 0.75 0.77  CALCIUM 7.6*  < > 7.7* 7.6* 7.6* 7.6*  MG 2.2  --  2.5* 2.6*  --   --   PHOS 2.8  --  3.2 2.7  --   --   GLUCOSE 250*  < > 325* 341* 275* 276*  < > = values in this interval not displayed.  Glucose Profile:  Recent Labs  04/22/15 1222 04/22/15 1413 04/22/15 1524   GLUCAP 329* 314* 294*   Meds: insulin drip, solumedrol, bowel regimen  Height:   Ht Readings from Last 1 Encounters:  04/15/15 6' (1.829 m)    Weight:   Wt Readings from Last 1 Encounters:  04/22/15 238 lb 8.6 oz (108.2 kg)    Filed Weights   04/15/15 1900 04/21/15 0551 04/22/15 0625  Weight: 253 lb 4.9 oz (114.9 kg) 239 lb 3.2 oz (108.5 kg) 238 lb 8.6 oz (108.2 kg)    BMI:  Body mass index is 32.34 kg/(m^2).  Estimated Nutritional Needs:   Kcal:  2182-2578 kcals (BEE 1653, 1.2 AF, 1.1-1.3 IF) Using IBW 81 kg  Protein:  89-105 g (1.1-1.3 g/kg)   Fluid:  2025-2430 mL (25-30 ml/kg)   HIGH Care Level  Kerman Passey MS, RD, LDN [phone] Pager  [phone] Weekend/On-Call Pager

## 2015-04-22 NOTE — Progress Notes (Signed)
[place] [facility] [place] NAME: Philip Lindsey    MR#:  [id number]  DATE OF BIRTH:  January 28, 1956  SUBJECTIVE:   No significant improvement in patient's breathing. He continues to be on both nonrebreather and high flow nasal cannula. REVIEW OF SYSTEMS:    Review of Systems  Constitutional: Positive for malaise/fatigue. Negative for chills.  HENT: Negative for sore throat.   Eyes: Negative for blurred vision.  Respiratory: Positive for shortness of breath. Negative for hemoptysis, positive sputum production and wheezing.   Cardiovascular: Negative for chest pain, palpitations and leg swelling.  Gastrointestinal: Negative for nausea, vomiting, abdominal pain, diarrhea and blood in stool.  Genitourinary: Negative for dysuria.  Musculoskeletal: Negative for back pain.  Neurological: Negative for dizziness, tremors, weakness and headaches.  Endo/Heme/Allergies: Does not bruise/bleed easily.    Tolerating Diet: Yes      DRUG ALLERGIES:  No Known Allergies  VITALS:  Blood pressure 139/63, pulse 110, temperature 98.3 F (36.8 C), temperature source Axillary, resp. rate 22, height 6' (1.829 m), weight 108.2 kg (238 lb 8.6 oz), SpO2 86 %.  PHYSICAL EXAMINATION:   Physical Exam  Constitutional: He is oriented to person, place, and time and well-developed, well-nourished, critically ill HENT:  Head: Normocephalic.  Eyes: No scleral icterus.  Neck: Normal range of motion. Neck supple. No JVD present. No tracheal deviation present.  Cardiovascular: Normal rate, regular rhythm and normal heart sounds.  Exam reveals no gallop and no friction rub.   No murmur heard. Pulmonary/Chest: Effort normal. No respiratory distress.Rhonchus breath sounds bilaterally  He exhibits no tenderness.  Abdominal: Soft. Bowel sounds are normal. He exhibits no distension and no mass. There is no tenderness. There is no rebound and no guarding.  Musculoskeletal:  Normal range of motion. He exhibits no edema.  Neurological: He is alert and oriented to person, place, and time.  Skin: Skin is warm. No rash noted. No erythema.  Psychiatric: Affect and judgment normal.      LABORATORY PANEL:   CBC  Recent Labs Lab 04/21/15 0625  WBC 8.8  HGB 14.6  HCT 44.3  PLT 175   ------------------------------------------------------------------------------------------------------------------  Chemistries   Recent Labs Lab 04/21/15 0625  04/22/15 0817  NA 135  < > 143  K 3.2*  < > 4.1  CL 96*  < > 106  CO2 28  < > 32  GLUCOSE 341*  < > 276*  BUN 25*  < > 27*  CREATININE 0.83  < > 0.77  CALCIUM 7.6*  < > 7.6*  MG 2.6*  --   --   < > = values in this interval not displayed. ------------------------------------------------------------------------------------------------------------------  Cardiac Enzymes  Recent Labs Lab 04/16/15 0013 04/16/15 0413  TROPONINI 0.07* 0.07*   ------------------------------------------------------------------------------------------------------------------  RADIOLOGY:  Dg Chest 1 View  04/21/2015  CLINICAL DATA:  PICC line insertion. EXAM: CHEST 1 VIEW COMPARISON:  Same day. FINDINGS: Stable cardiomediastinal silhouette. Stable bilateral lung opacities are noted concerning for edema or inflammation. No pneumothorax or significant pleural effusion is noted. Interval placement of left-sided PICC line with distal tip in expected position of cavoatrial junction. Bony thorax is unremarkable. IMPRESSION: Stable bilateral lung opacities concerning for edema or inflammation. Interval placement of left-sided PICC line with distal tip in expected position of cavoatrial junction. Electronically Signed   By: Marijo Conception, M.D.   On: 04/21/2015 16:15   Dg Chest Port 1 View  04/21/2015  CLINICAL DATA:  60 year old with  acute respiratory failure. Followup pneumonia. EXAM: PORTABLE CHEST 1 VIEW COMPARISON:  04/18/2015 and  earlier, including CTA chest 04/16/2015 and CT chest 04/15/2015. FINDINGS: Cardiac silhouette normal in size for AP portable technique, unchanged. Patchy airspace opacities throughout both lungs, associated with scattered air bronchograms, waxing and waning. Improved aeration in the right upper lobe and both lower lobes since the most recent prior examination, but worsening aeration in the left upper lobe. No new areas of airspace opacity. No visible pleural effusions. IMPRESSION: Patchy airspace opacities throughout both lungs, waxing and waning over the past 4-5 days. Query atypical pneumonia versus acute inflammatory lung disease versus ARDS. No new areas of disease in either lung. Electronically Signed   By: Evangeline Dakin M.D.   On: 04/21/2015 11:01     ASSESSMENT AND PLAN:   60-year-old male with a history of prostate cancer, hypothyroidism who presented with sepsis from community-acquired pneumonia.  1. Sepsis: Due to pneumonia ,continue ceftriaxone and azithromycin Slow to improve  2. Acute hypoxic respiratory failure due to severe Community-acquired pneumonia  Chest x-ray consistent with possible ARDS, possible AIP.  On high-dose Solu-Medrol since yesterday no significant   3. Elevated troponin: This is demand ischemia and not ACS. Echocardiogram shows normal ejection fraction without valvular abnormalities.  4. Hypokalemia: Pharmacy consult for replacement.   5. Hypocalcemia: improved.  6. Sinus tachycardia: This is due to fever and sepsis. Continue telemetry  7. Hypothyroid: Continue Synthroid Management plans discussed with the patient and he is in agreement.   8. Hyperglycemia: continue lantus and ssi CODE STATUS: FULL  Critical care TOTAL TIME TAKING CARE OF THIS PATIENT: 33 minutes.   Case discussed with pulmonary and family there is thought about transferring him to [facility] will make arrangements.     Dustin Flock M.D on 04/22/2015 at 2:44 PM  Between 7am to  6pm - Pager - [phone] After 6pm go to [email or web address] - password [email or web address] [facility]  Office  [phone]  CC: Primary care physician; Ria Bush, MD  Note: This dictation was prepared with Dragon dictation along with smaller phrase technology. Any transcriptional errors that result from this process are unintentional.

## 2015-04-22 NOTE — Progress Notes (Signed)
Pt. Was taken off bipap and placed on HFNC with a NRB.

## 2015-04-22 NOTE — Discharge Summary (Signed)
Philip Lindsey, 60 y.o., DOB October 02, 1955, MRN [id number]. Admission date: 04/13/2015 Discharge Date 04/22/2015 Primary MD Ria Bush, MD Admitting Physician Saundra Shelling, MD  Admission Diagnosis  Cough [R05] Community acquired pneumonia [J18.9] Sepsis, due to unspecified organism [facility]) [A41.9] Fever, unspecified fever cause [R50.9]  Discharge Diagnosis   Principal Problem:   Community acquired pneumonia Acute respiratory failure due to ARDS , possible AIP Hypoglycemia felt to be due to high-dose steroids History of prostate cancer status post prostatectomy Hypothyroidism History of chickenpox    Hospital Course  Philip Lindsey is a 60 y.o. male with a known history of prostate cancer, hypothyroidism presented to the emergency room with fever and cough. Patient also had some chills. Patient had symptoms for 4-5 days prior to coming to the emergency room. When he arrived in the ER initially the chest x-ray showed bilateral infiltrates. He was thought to have community-acquired pneumonia. And was started on antibiotics. Patient was initially admitted to the floor however his symptoms and his respiratory status worsened therefore he had to be transferred to the ICU. He had a CT scan of the chest done on February 8. It showed multifocal pneumonia bilateral lower lobe predominant. Next day patient had a CT scan per PE protocol which showed diffuse bilateral lower lobe base airspace disease with worsening. Patient was maintained on IV antibiotics and high flow oxygen via nasal cannula and face mask. He has not shown any improvement. He was started on high-dose Solu-Medrol and concern for AIP. Patient has still not shown any response. He is being transferred to tertiary care center for further evaluation.  Patient did have echo which did not show any significant abnormality.  He is also having elevated blood sugars felt to be due to high-dose steroids she is started on an insulin  drip.        Consults  pulmonary/intensive care  Significant Tests:  See full reports for all details      Dg Chest 1 View  04/21/2015  CLINICAL DATA:  PICC line insertion. EXAM: CHEST 1 VIEW COMPARISON:  Same day. FINDINGS: Stable cardiomediastinal silhouette. Stable bilateral lung opacities are noted concerning for edema or inflammation. No pneumothorax or significant pleural effusion is noted. Interval placement of left-sided PICC line with distal tip in expected position of cavoatrial junction. Bony thorax is unremarkable. IMPRESSION: Stable bilateral lung opacities concerning for edema or inflammation. Interval placement of left-sided PICC line with distal tip in expected position of cavoatrial junction. Electronically Signed   By: Marijo Conception, M.D.   On: 04/21/2015 16:15   Dg Chest 1 View  04/16/2015  CLINICAL DATA:  Shortness of breath . EXAM: CHEST 1 VIEW COMPARISON:  CT 04/16/2015 .  04/15/2015. FINDINGS: Mediastinum hilar structures normal. Mild cardiomegaly and pulmonary vascular prominence. Persistent multifocal pulmonary infiltrates noted most consistent with pneumonia. A component of pulmonary edema cannot be excluded. No pleural effusion or pneumothorax. IMPRESSION: 1. Persistent multifocal pulmonary infiltrates most consistent multifocal pneumonia. A component pulmonary edema cannot be excluded. No significant interim improvement. 2. Mild cardiomegaly and mild pulmonary vascular prominence . Electronically Signed   By: Marcello Moores  Register   On: 04/16/2015 07:36   Dg Chest 1 View  04/15/2015  CLINICAL DATA:  CHF, community-acquired pneumonia, sepsis. EXAM: CHEST 1 VIEW COMPARISON:  PA and lateral chest x-ray of April 13, 2015 FINDINGS: The lungs remain hyperinflated. The interstitial markings remain increased and are slightly more conspicuous at both lung bases. There is no pleural effusion or pneumothorax. The  heart is normal in size. The pulmonary vascularity is not engorged. The  trachea is midline. The observed bony thorax exhibits no acute abnormality. IMPRESSION: Worsening of interstitial densities in the mid and lower lungs most compatible with worsening of pneumonia superimposed on COPD. There is stable mild central pulmonary vascular congestion without cardiomegaly. Electronically Signed   By: David  Martinique M.D.   On: 04/15/2015 07:27   Dg Chest 2 View  04/13/2015  CLINICAL DATA:  Chronic cough, shortness of breath, generalized weakness and fever. Initial encounter. EXAM: CHEST  2 VIEW COMPARISON:  None. FINDINGS: The lungs are well-aerated. Vascular congestion is noted. Mildly increased lung markings may reflect atelectasis or possibly mild infection, given the patient's symptoms. There is no evidence of pleural effusion or pneumothorax. The heart is normal in size; the mediastinal contour is within normal limits. No acute osseous abnormalities are seen. IMPRESSION: Vascular congestion noted. Mildly increased lung markings may reflect atelectasis or possibly mild infection, given the patient's symptoms. Electronically Signed   By: Garald Balding M.D.   On: 04/13/2015 23:43   Dg Abd 1 View  04/20/2015  CLINICAL DATA:  Constipation. Prostate cancer status post prostatectomy. EXAM: ABDOMEN - 1 VIEW COMPARISON:  None. FINDINGS: Nonobstructive bowel gas pattern. Normal colonic stool burden. Surgical clips related to prior prostatectomy with pelvic lymph node dissection. Mild degenerative changes of the lower lumbar spine. IMPRESSION: Unremarkable abdominal radiograph. Electronically Signed   By: Julian Hy M.D.   On: 04/20/2015 12:29   Ct Chest Wo Contrast  04/15/2015  CLINICAL DATA:  Cough, fever, shortness of breath x1 week. History of prostate cancer. Pneumonia. EXAM: CT CHEST WITHOUT CONTRAST TECHNIQUE: Multidetector CT imaging of the chest was performed following the standard protocol without IV contrast. COMPARISON:  Chest radiograph dated 04/15/2015. FINDINGS:  Mediastinum/Nodes: The heart is normal in size. No pericardial effusion. Atherosclerotic calcifications of the aortic arch. Small mediastinal lymph nodes measuring up to 9 mm short axis. Lungs/Pleura: Multifocal patchy/ ground-glass opacities throughout all lobes, bilateral lower lobe and right upper lobe predominant, suspicious for multifocal pneumonia. Underlying mild paraseptal emphysematous changes in the bilateral upper lobes. No suspicious pulmonary nodules. No pleural effusion or pneumothorax. Upper abdomen: Visualized upper abdomen is notable for moderate hepatic steatosis. Musculoskeletal: Degenerative changes of the visualized thoracolumbar spine. IMPRESSION: Multifocal pneumonia, bilateral lower lobe predominant. Electronically Signed   By: Julian Hy M.D.   On: 04/15/2015 18:25   Ct Angio Chest Pe W/cm &/or Wo Cm  04/16/2015  CLINICAL DATA:  Acute onset of sepsis and pneumonia. Decreased O2 saturation. Initial encounter. EXAM: CT ANGIOGRAPHY CHEST WITH CONTRAST TECHNIQUE: Multidetector CT imaging of the chest was performed using the standard protocol during bolus administration of intravenous contrast. Multiplanar CT image reconstructions and MIPs were obtained to evaluate the vascular anatomy. CONTRAST:  138mL OMNIPAQUE IOHEXOL 350 MG/ML SOLN COMPARISON:  CT of the chest performed 04/15/2015 FINDINGS: There is no evidence of pulmonary embolus. Diffuse bilateral lower lobe airspace opacification is noted, with ground-glass airspace opacity, somewhat worsened from the prior study. Additional patchy airspace opacities are seen within the remaining portions of both lungs bilaterally. This may reflect multifocal pneumonia, possibly atypical in nature. Certain inflammatory conditions could conceivably have a similar appearance. There is no evidence of pleural effusion or pneumothorax. No masses are identified; no abnormal focal contrast enhancement is seen. Visualized mediastinal nodes remain normal  in size. No pericardial effusion is identified. The great vessels are grossly unremarkable in appearance. No axillary lymphadenopathy is seen.  The visualized portions of the thyroid gland are unremarkable in appearance. The visualized portions of the liver and spleen are unremarkable. No acute osseous abnormalities are seen. Review of the MIP images confirms the above findings. IMPRESSION: 1. No evidence of pulmonary embolus. 2. Diffuse bilateral lower lobe airspace opacification, with ground-glass airspace opacity, somewhat worsened from the prior study. Additional patchy airspace opacities within the remaining portions of both lungs bilaterally. This is concerning for multifocal pneumonia, possibly atypical in nature. Certain inflammatory conditions could conceivably have a similar appearance. Electronically Signed   By: Garald Balding M.D.   On: 04/16/2015 01:10   Dg Chest Port 1 View  04/21/2015  CLINICAL DATA:  60 year old with acute respiratory failure. Followup pneumonia. EXAM: PORTABLE CHEST 1 VIEW COMPARISON:  04/18/2015 and earlier, including CTA chest 04/16/2015 and CT chest 04/15/2015. FINDINGS: Cardiac silhouette normal in size for AP portable technique, unchanged. Patchy airspace opacities throughout both lungs, associated with scattered air bronchograms, waxing and waning. Improved aeration in the right upper lobe and both lower lobes since the most recent prior examination, but worsening aeration in the left upper lobe. No new areas of airspace opacity. No visible pleural effusions. IMPRESSION: Patchy airspace opacities throughout both lungs, waxing and waning over the past 4-5 days. Query atypical pneumonia versus acute inflammatory lung disease versus ARDS. No new areas of disease in either lung. Electronically Signed   By: Evangeline Dakin M.D.   On: 04/21/2015 11:01   Dg Chest Port 1 View  04/18/2015  CLINICAL DATA:  Respiratory failure EXAM: PORTABLE CHEST 1 VIEW COMPARISON:  April 17, 2015 FINDINGS: There is now widespread airspace consolidation throughout the mid and lower lung zones, increased from 1 day prior. Heart is mildly enlarged with pulmonary venous hypertension. No adenopathy. IMPRESSION: Evidence of congestive heart failure. Increase in alveolar edema bilaterally. Superimposed pneumonia cannot be excluded. Both congestive heart failure and pneumonia may exist concurrently. Electronically Signed   By: Lowella Grip III M.D.   On: 04/18/2015 08:02   Dg Chest Port 1 View  04/17/2015  CLINICAL DATA:  60 year old male with bilateral pneumonia. Respiratory failure. Initial encounter. EXAM: PORTABLE CHEST 1 VIEW COMPARISON:  04/16/2015 and earlier FINDINGS: Portable AP upright view at 0600 hours. Bilateral widespread mixed interstitial and airspace opacity is stable to slightly increased since yesterday, and progressed since 04/15/2015 (particularly in the right upper lung). No pleural effusion identified. Stable cardiac size and mediastinal contours. Visualized tracheal air column is within normal limits. No pneumothorax. IMPRESSION: Radiographic progression of bilateral pneumonia since 04/15/2015, not significantly changed since yesterday. No pleural effusion identified. Electronically Signed   By: Genevie Ann M.D.   On: 04/17/2015 07:20       Today   Subjective:   Philip Lindsey continues to be very short of breath and dry cough  Objective:   Blood pressure 139/63, pulse 110, temperature 98.3 F (36.8 C), temperature source Axillary, resp. rate 22, height 6' (1.829 m), weight 108.2 kg (238 lb 8.6 oz), SpO2 86 %.  .  Intake/Output Summary (Last 24 hours) at 04/22/15 1446 Last data filed at 04/22/15 1237  Gross per 24 hour  Intake    212 ml  Output   1475 ml  Net  -1263 ml    Exam VITAL SIGNS: Blood pressure 139/63, pulse 110, temperature 98.3 F (36.8 C), temperature source Axillary, resp. rate 22, height 6' (1.829 m), weight 108.2 kg (238 lb 8.6 oz), SpO2 86  %.  Physical Exam  Constitutional:  He is oriented to person, place, and time and well-developed, well-nourished, critically ill HENT:  Head: Normocephalic.  Eyes: No scleral icterus.  Neck: Normal range of motion. Neck supple. No JVD present. No tracheal deviation present.  Cardiovascular: Normal rate, regular rhythm and normal heart sounds. Exam reveals no gallop and no friction rub.  No murmur heard. Pulmonary/Chest: Effort normal. No respiratory distress.Rhonchus breath sounds bilaterally He exhibits no tenderness.  Abdominal: Soft. Bowel sounds are normal. He exhibits no distension and no mass. There is no tenderness. There is no rebound and no guarding.  Musculoskeletal: Normal range of motion. He exhibits no edema.  Neurological: He is alert and oriented to person, place, and time.  Skin: Skin is warm. No rash noted. No erythema.  Psychiatric: Affect and judgment normal.  Data Review     CBC w Diff: Lab Results  Component Value Date   WBC 8.8 04/21/2015   HGB 14.6 04/21/2015   HCT 44.3 04/21/2015   PLT 175 04/21/2015   LYMPHOPCT 10 04/14/2015   MONOPCT 13 04/14/2015   EOSPCT 1 04/14/2015   BASOPCT 0 04/14/2015   CMP: Lab Results  Component Value Date   NA 143 04/22/2015   K 4.1 04/22/2015   CL 106 04/22/2015   CO2 32 04/22/2015   BUN 27* 04/22/2015   CREATININE 0.77 04/22/2015   PROT 5.3* 04/14/2015   ALBUMIN 2.9* 04/14/2015   BILITOT 0.5 04/14/2015   ALKPHOS 58 04/14/2015   AST 41 04/14/2015   ALT 35 04/14/2015  .  Micro Results Recent Results (from the past 240 hour(s))  Rapid Influenza A&B Antigens ([facility] only)     Status: None   Collection Time: 04/13/15 10:57 PM  Result Value Ref Range Status   Influenza A ([facility]) NOT DETECTED  Final   Influenza B ([facility]) NOT DETECTED  Final  Blood Culture (routine x 2)     Status: None   Collection Time: 04/13/15 10:58 PM  Result Value Ref Range Status   Specimen Description BLOOD LEFT ANTECUBITAL  Final    Special Requests BOTTLES DRAWN AEROBIC AND ANAEROBIC 5ML  Final   Culture NO GROWTH 5 DAYS  Final   Report Status 04/18/2015 FINAL  Final  Blood Culture (routine x 2)     Status: None   Collection Time: 04/13/15 10:58 PM  Result Value Ref Range Status   Specimen Description BLOOD RIGHT ANTECUBITAL  Final   Special Requests BOTTLES DRAWN AEROBIC AND ANAEROBIC 10ML  Final   Culture NO GROWTH 5 DAYS  Final   Report Status 04/18/2015 FINAL  Final  Urine culture     Status: None   Collection Time: 04/13/15 10:58 PM  Result Value Ref Range Status   Specimen Description URINE, CLEAN CATCH  Final   Special Requests NONE  Final   Culture MULTIPLE SPECIES PRESENT, SUGGEST RECOLLECTION  Final   Report Status 04/15/2015 FINAL  Final  MRSA PCR Screening     Status: None   Collection Time: 04/15/15  6:18 PM  Result Value Ref Range Status   MRSA by PCR NEGATIVE NEGATIVE Final    Comment:        The GeneXpert MRSA Assay (FDA approved for NASAL specimens only), is one component of a comprehensive MRSA colonization surveillance program. It is not intended to diagnose MRSA infection nor to guide or monitor treatment for MRSA infections.         Code Status Orders        Start  Ordered   04/14/15 0227  Full code   Continuous     04/14/15 0227    Code Status History    Date Active Date Inactive Code Status Order ID Comments User Context   04/14/2015 12:44 AM 04/14/2015  2:27 AM Full Code [id number]  Saundra Shelling, MD ED          Follow-up Information    Follow up with Ria Bush, MD On 04/30/2015.   Specialty:  Family Medicine   Why:  at 11:50AM   Contact information:   [address] [place] [address] [phone]       Discharge Medications     Medication List    ASK your doctor about these medications        levothyroxine 50 MCG tablet  Commonly known as:  SYNTHROID, LEVOTHROID  Take 50 mcg by mouth daily. Reported on 04/13/2015            Total Time in preparing paper work, data evaluation and todays exam -64min critical care time spent additionally and patient's transfer  Dustin Flock M.D on 04/22/2015 at 2:46 PM  [facility]   Office  [phone]

## 2015-04-22 NOTE — Progress Notes (Signed)
CC resp failure, resp distress HPI:  This morning wearing HFNC with NRB to maintain sats greater than 88% Still remains critically ill,on high flow [place] at 100% along with NRB.  He is fully awake and mentating, but looking more tired this morning.  I have talked with patient regarding high risk for vent support, also updated the patient's wife, if no improvement by this afternoon, will plan for intubation.   Cognition remains intact. No new complaints, high risk for intubation and cardiac arrest  Filed Vitals:   04/22/15 1553 04/22/15 1600 04/22/15 1700 04/22/15 1800  BP:  162/92 210/149 80/62  Pulse:  131 141 118  Temp: 97.8 F (36.6 C)     TempSrc: Oral     Resp:  29 20 20   Height:      Weight:      SpO2:  92% 96% 98%   HEENT WNL, increased WOB, mild nasal cyanosis No JVD B crackles, no wheezes Reg, no M NABS, soft Ext warm, no edema No focal neuro deficits  BMP Latest Ref Rng 04/22/2015 04/22/2015 04/21/2015  Glucose 65 - 99 mg/dL 276(H) 275(H) 341(H)  BUN 6 - 20 mg/dL 27(H) 28(H) 25(H)  Creatinine 0.61 - 1.24 mg/dL 0.77 0.75 0.83  Sodium 135 - 145 mmol/L 143 143 135  Potassium 3.5 - 5.1 mmol/L 4.1 3.9 3.2(L)  Chloride 101 - 111 mmol/L 106 107 96(L)  CO2 22 - 32 mmol/L 32 29 28  Calcium 8.9 - 10.3 mg/dL 7.6(L) 7.6(L) 7.6(L)    CBC Latest Ref Rng 04/21/2015 04/19/2015 04/17/2015  WBC 3.8 - 10.6 K/uL 8.8 7.4 5.0  Hemoglobin 13.0 - 18.0 g/dL 14.6 13.6 14.3  Hematocrit 40.0 - 52.0 % 44.3 40.4 43.1  Platelets 150 - 440 K/uL 175 152 165   Anti-infectives    Start     Dose/Rate Route Frequency Ordered Stop   04/21/15 1000  azithromycin (ZITHROMAX) tablet 500 mg     500 mg Oral Daily 04/20/15 1847     04/18/15 1000  azithromycin (ZITHROMAX) 500 mg in dextrose 5 % 250 mL IVPB  Status:  Discontinued     500 mg 250 mL/hr over 60 Minutes Intravenous Every 24 hours 04/17/15 1307 04/20/15 1847   04/18/15 1000  cefTRIAXone (ROCEPHIN) 1 g in dextrose 5 % 50 mL IVPB     1 g 100 mL/hr  over 30 Minutes Intravenous Every 24 hours 04/17/15 1307 04/24/15 1114   04/17/15 0930  cefTRIAXone (ROCEPHIN) 1 g in dextrose 5 % 50 mL IVPB  Status:  Discontinued     1 g 100 mL/hr over 30 Minutes Intravenous Every 24 hours 04/17/15 0916 04/17/15 1307   04/16/15 0100  vancomycin (VANCOCIN) IVPB 1000 mg/200 mL premix  Status:  Discontinued     1,000 mg 200 mL/hr over 60 Minutes Intravenous Every 8 hours 04/15/15 1837 04/16/15 0935   04/15/15 1830  vancomycin (VANCOCIN) IVPB 1000 mg/200 mL premix     1,000 mg 200 mL/hr over 60 Minutes Intravenous  Once 04/15/15 1746 04/15/15 2230   04/15/15 1730  ceFEPIme (MAXIPIME) 2 g in dextrose 5 % 50 mL IVPB  Status:  Discontinued     2 g 100 mL/hr over 30 Minutes Intravenous 3 times per day 04/15/15 1717 04/17/15 0916   04/15/15 1000  cefTRIAXone (ROCEPHIN) 1 g in dextrose 5 % 50 mL IVPB  Status:  Discontinued     1 g 100 mL/hr over 30 Minutes Intravenous Every 24 hours 04/14/15 0106 04/15/15 1715  04/15/15 0900  azithromycin (ZITHROMAX) 500 mg in dextrose 5 % 250 mL IVPB  Status:  Discontinued     500 mg 250 mL/hr over 60 Minutes Intravenous Every 24 hours 04/14/15 0106 04/17/15 1307   04/14/15 0100  piperacillin-tazobactam (ZOSYN) IVPB 3.375 g  Status:  Discontinued     3.375 g 12.5 mL/hr over 240 Minutes Intravenous STAT 04/14/15 0053 04/14/15 0058   04/14/15 0100  piperacillin-tazobactam (ZOSYN) IVPB 3.375 g     3.375 g 100 mL/hr over 30 Minutes Intravenous STAT 04/14/15 0057 04/14/15 0120   04/14/15 0045  cefTRIAXone (ROCEPHIN) 1 g in dextrose 5 % 50 mL IVPB     1 g 100 mL/hr over 30 Minutes Intravenous  Once 04/14/15 0038 04/14/15 0205   04/14/15 0045  azithromycin (ZITHROMAX) 500 mg in dextrose 5 % 250 mL IVPB     500 mg 250 mL/hr over 60 Minutes Intravenous  Once 04/14/15 0038 04/14/15 0653   04/14/15 0000  azithromycin (ZITHROMAX) 500 mg in dextrose 5 % 250 mL IVPB  Status:  Discontinued     500 mg 250 mL/hr over 60 Minutes  Intravenous  Once 04/13/15 2353 04/14/15 0106   04/13/15 2315  vancomycin (VANCOCIN) IVPB 1000 mg/200 mL premix     1,000 mg 200 mL/hr over 60 Minutes Intravenous  Once 04/13/15 2313 04/14/15 0049   04/13/15 2315  piperacillin-tazobactam (ZOSYN) IVPB 3.375 g  Status:  Discontinued     3.375 g 12.5 mL/hr over 240 Minutes Intravenous  Once 04/13/15 2313 04/14/15 0052      CXR: Patchy R>L infiltrates - slightly increased 02/10  CT chest (02/09): No evidence of pulmonary embolus. 2. Diffuse bilateral lower lobe airspace opacification, with ground-glass airspace opacity, somewhat worsened from the prior study. Additional patchy airspace opacities within the remaining portions of both lungs bilaterally. This is concerning for multifocal pneumonia, possibly atypical in nature  ECHO 2/7 EF 55%  IMPRESSION:  60 yo white male with acute and severe hypoxic resp failure with progressive b/l Infiltrates likely from acute atypical pneumonia/pneumonitis  1.Respiratory Failure - -continue biPAP and High flow intermittently as needed, imminent intubation later today anticipated -scheduled BD Therapy - possible AIP, CT chest reviewed, temporal association with symptoms.  High dose steroids, 1g x 3 days (last dose 2/15), with no significant improvement today.  Spoke with hospitalist about arranging transfer to possible ECMO center.   2.Pneumonia -on Iv abx(rocephin and azithromycin) - zosyn -no need for vanc at this time  3.pneumonitis - possible AIP -continue IV steroids - as stated above.     The Patient requires high complexity decision making for assessment and support, frequent evaluation and titration of therapies, application of advanced monitoring technologies and extensive interpretation of multiple databases. Critical Care Time devoted to patient care services described in this note is 35 minutes.   Overall, patient is critically ill, prognosis is guarded.  Patient is h risk for cardiac  arrest and death due to his severe hypoxic resp failure.  Vilinda Boehringer, MD [facility] Pager [phone] (please enter 7-digits) On Call Pager - [phone] (please enter 7-digits) Clinic - [phone]

## 2015-04-22 NOTE — Progress Notes (Signed)
Inpatient Diabetes Program Recommendations  AACE/ADA: New Consensus Statement on Inpatient Glycemic Control (2015)  Target Ranges:  Prepandial:   less than 140 mg/dL      Peak postprandial:   less than 180 mg/dL (1-2 hours)      Critically ill patients:  140 - 180 mg/dL   Review of Glycemic ControlResults for [name], Philip Lindsey (MRN [id number]) as of 04/22/2015 12:12  Ref. Range 04/21/2015 16:27 04/21/2015 20:09 04/22/2015 00:03 04/22/2015 04:36 04/22/2015 07:46  Glucose-Capillary Latest Ref Range: 65-99 mg/dL 259 (H) 209 (H) 261 (H) 252 (H) 249 (H)    Current orders for Inpatient glycemic control: Lantus 20 units q HS, Novolog resistant q 4 hours, Novolog 4 units tid with meals   Inpatient Diabetes Program Recommendations: Note that CBG's remain greater than goal.  If appropriate, may consider IV insulin to control blood sugars while on IV solumedrol.  Thanks, Adah Perl, RN, BC-ADM Inpatient Diabetes Coordinator Pager [phone] (8a-5p)

## 2015-04-22 NOTE — Progress Notes (Signed)
Update: -Patient still with hypoxia requiring intermittent BiPAP, supplemented with high flow nasal cannula along with a nonrebreather mask to maintain O2 saturations greater than 88%. This been ongoing for almost 6 days now. At this point patient has accepted intubation and possible transfer to tertiary care center for possible lung biopsy, wedge resection, and possible ECMO. -He was intubated today, requiring high PEEP levels up to 12 to maintain O2 saturations greater than 90%, he has a bed accepted at [facility] in [place] at the ICU, and will be transferred there at some point tonight.  Philip Boehringer, MD [facility] Pager [phone] (please enter 7-digits) On Call Pager - [phone] (please enter 7-digits)

## 2015-04-22 NOTE — Procedures (Signed)
Procedure Note:  Orotracheal Intubation  Implied consent due to emergent nature of patient's condition. Correct Patient, Name & ID confirmed.  The patient was pre-oxygenated and then, under direct visualization, a 8 mm cuffed endotracheal tube was placed through the vocal cords into the trachea, using the Glidescope.  Total attempts made 1, using Glidescope During intubation an assistant applied gentle pressure to the cricoid cartilage.  Position confirmed by auscultation of lungs (good breath sounds bilaterally) and no stomach sounds.  Tube secured at 22 cm.CO2 detector in place with appropriate color change.   Pulse ox 96 % after bagging and placing on vent with PEEP of 12 .  Intubation complicated with patient with desaturation down to mid 40% during sedation, emergently bagged, intubated and quick recovery of O2 sats with bagging and PEEP from mechanical ventilator.    CXR ordered.   Vilinda Boehringer, MD [facility] Pager [phone] On Call Pager [phone]

## 2015-04-22 NOTE — Progress Notes (Signed)
Pt is bet. High flow and bi-pap . Is on high flow now. sao2 89-91%. VSS. Alert and oriented. Resting comfortably in bed. Continue to observe closely.

## 2015-04-22 NOTE — Care Management (Signed)
Patient's respiratory status not improved.  Remains on high flow nasal cannula. Transfer to tertiary facility is being discussed for possible acute interstitial pneumonitis.  There has been discussion regarding intubation. Have discussed that patient can not be transsrted by [facility] with HFNC- would have to at least be on bipap, and not sure of the limitations of percentage of 02.  Attending has discussed the likelihood of receiving facility requesting intubation prior to transfer

## 2015-04-22 NOTE — Progress Notes (Signed)
Pt with increased CBGs in 300, insulin gtt started per protocol, MD Posey Pronto updated

## 2015-04-24 ENCOUNTER — Telehealth: Payer: Self-pay

## 2015-04-24 NOTE — Telephone Encounter (Signed)
Initial TCM call - LVMM with contact info

## 2015-04-29 NOTE — Telephone Encounter (Signed)
2nd attempt to reach pt to complete TCM. Per Ms. Sheran Spine, patient is deceased.

## 2015-04-30 ENCOUNTER — Ambulatory Visit: Payer: BLUE CROSS/BLUE SHIELD | Admitting: Family Medicine

## 2015-05-06 DEATH — deceased

## 2017-04-12 IMAGING — CR DG CHEST 2V
1 series · 2 of 2 positions shown · non-contrast
Comparison: None.

CLINICAL DATA: Chronic cough, shortness of breath, generalized
weakness and fever. Initial encounter.

EXAM:
CHEST  2 VIEW

[Series 1: dg chest 2 view · 0.14mm/px · 2 of 2 slices shown]
[im 1/2]
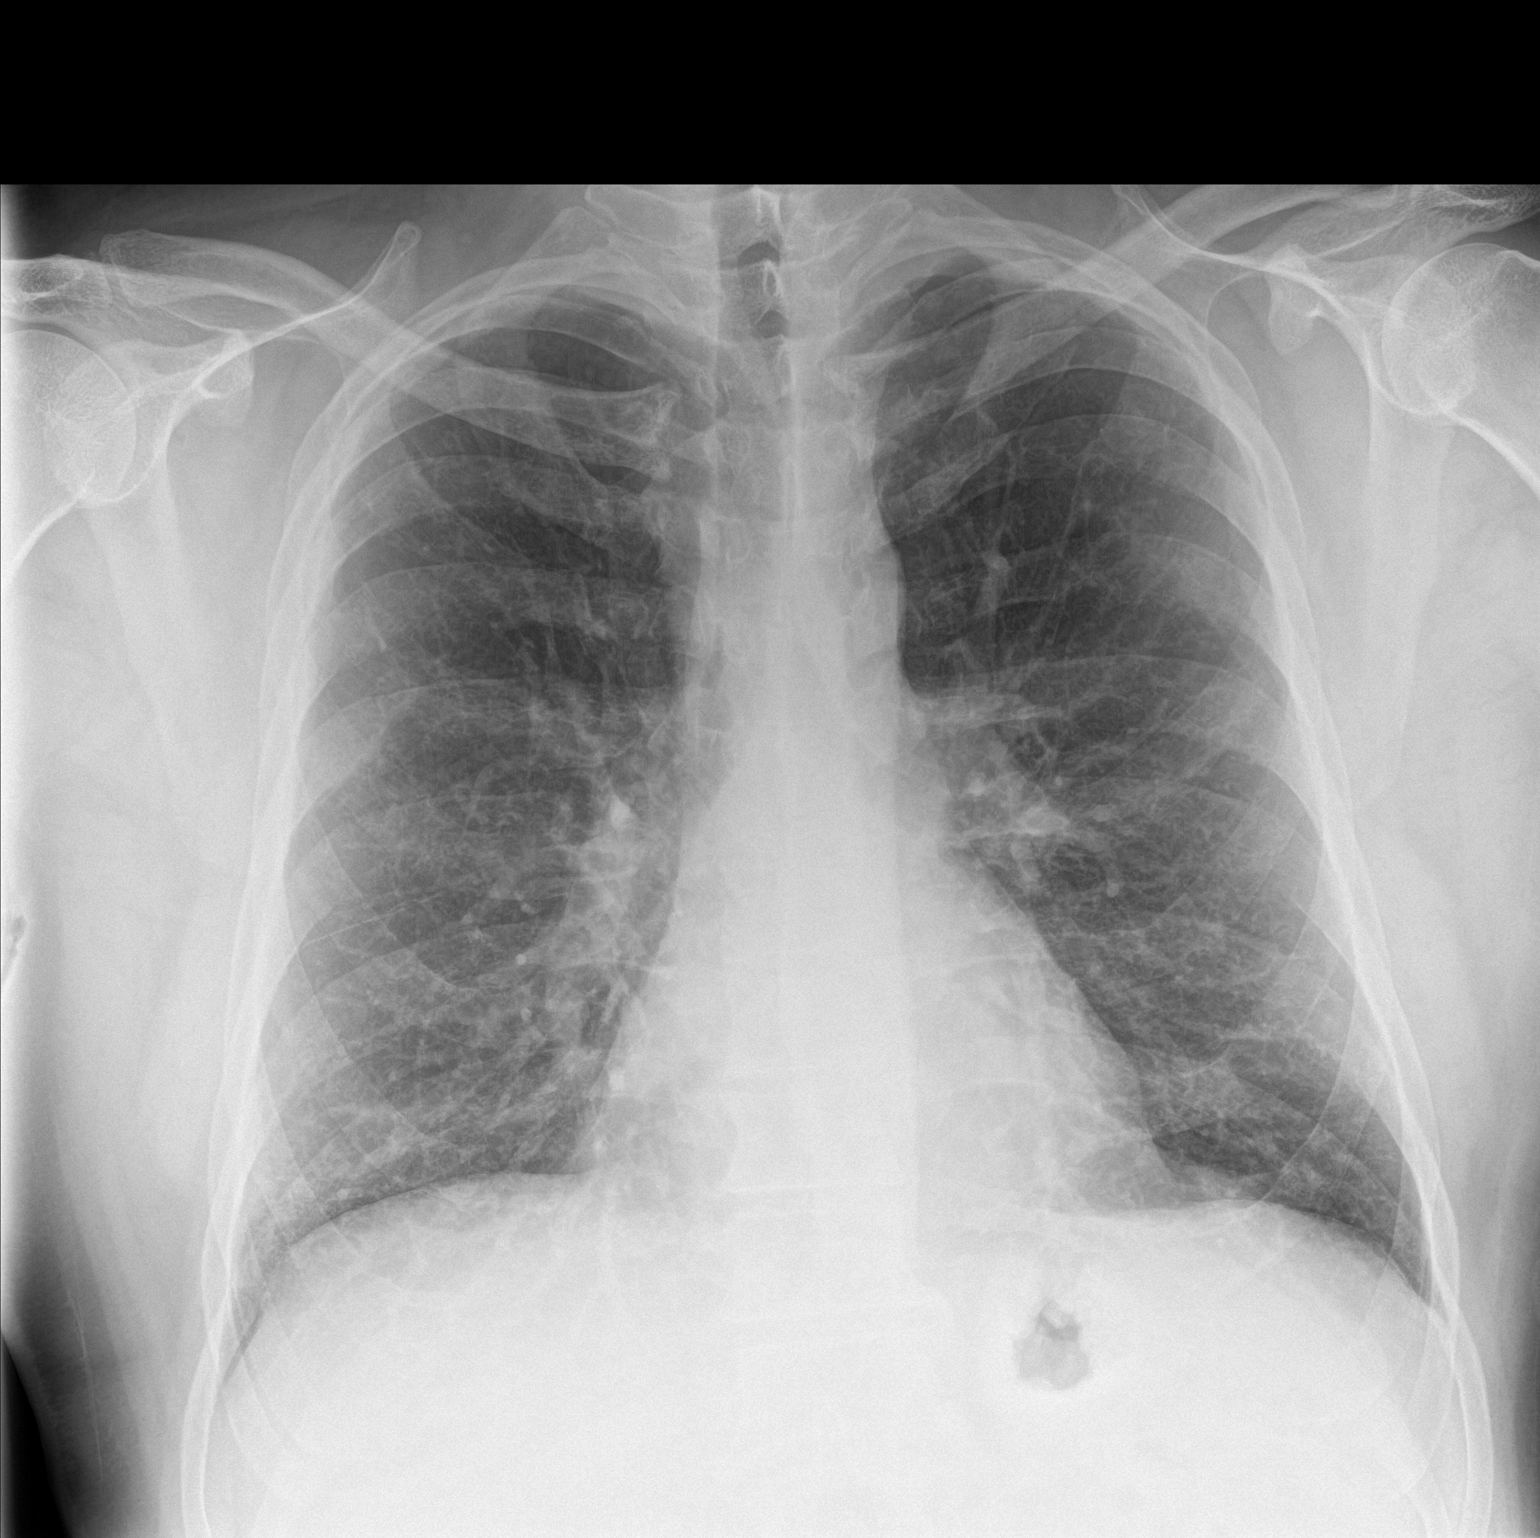
[im 2/2]
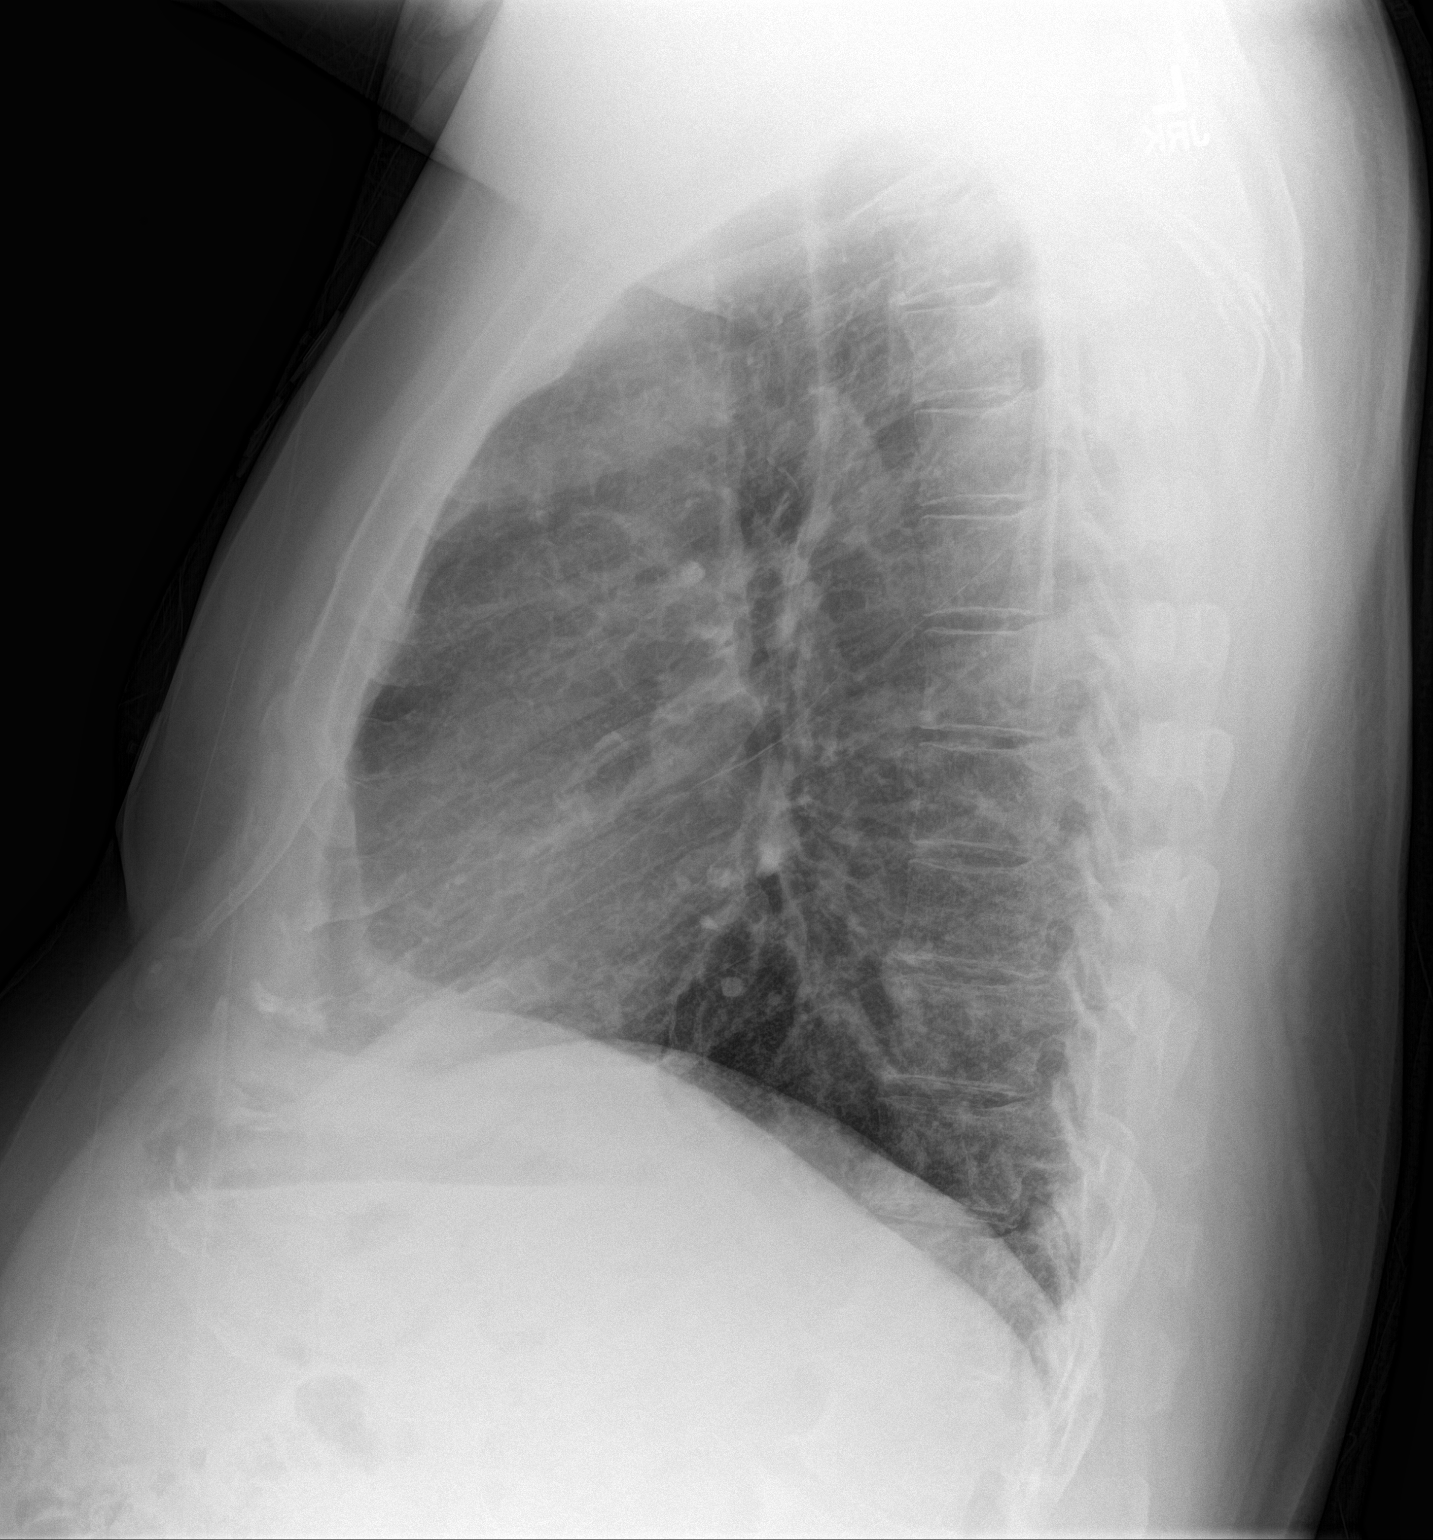

[2 of 2 positions shown; findings below may reference images not displayed]

FINDINGS: The lungs are well-aerated. Vascular congestion is noted. Mildly
increased lung markings may reflect atelectasis or possibly mild
infection, given the patient's symptoms. There is no evidence of
pleural effusion or pneumothorax.

The heart is normal in size; the mediastinal contour is within
normal limits. No acute osseous abnormalities are seen.
IMPRESSION: Vascular congestion noted. Mildly increased lung markings may
reflect atelectasis or possibly mild infection, given the patient's
symptoms.

## 2017-04-14 IMAGING — CT CT CHEST W/O CM
1 series · 16 of 33 positions shown, 20 images · non-contrast
Comparison: Chest radiograph dated 04/15/2015.

CLINICAL DATA: Cough, fever, shortness of breath x1 week. History
of prostate cancer. Pneumonia.

EXAM:
CT CHEST WITHOUT CONTRAST
TECHNIQUE: Multidetector CT imaging of the chest was performed following the
standard protocol without IV contrast.

[Series 2: routine chest wo · axial · 0.81mm/px · z∈[-1012,-678]mm · 16 of 73 slices shown, 20 images]
[im 3/73  mediastinal]
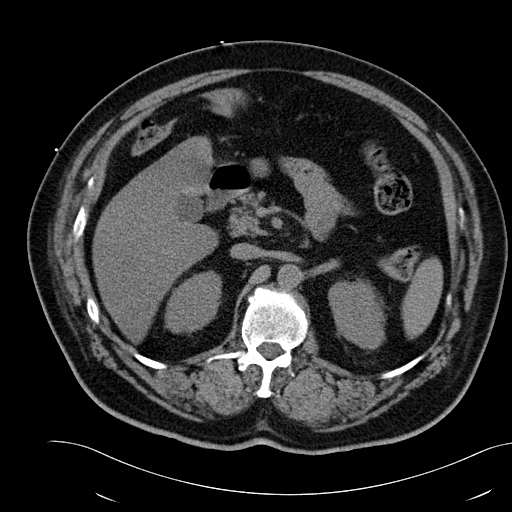
[im 3/73  lung]
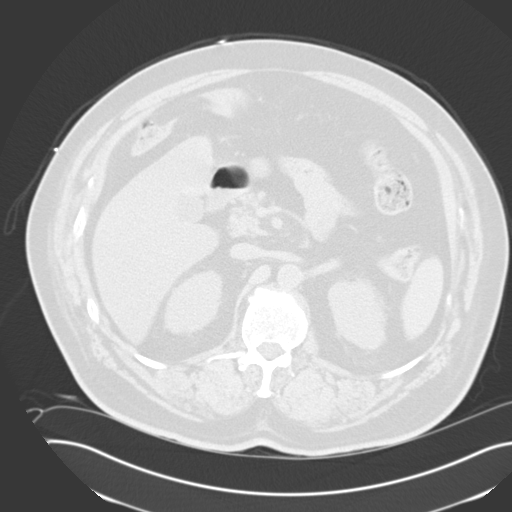
[im 9/73  lung]
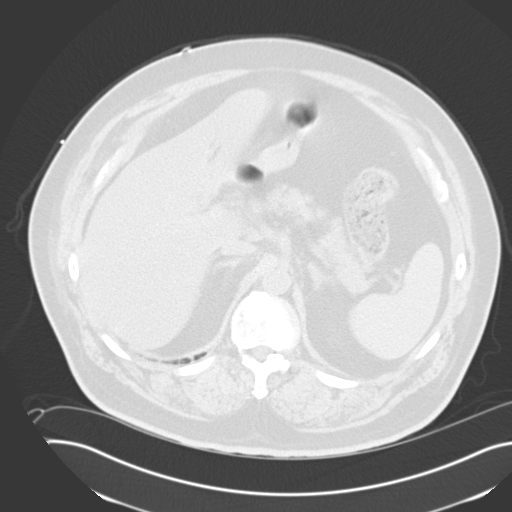
[im 14/73  lung]
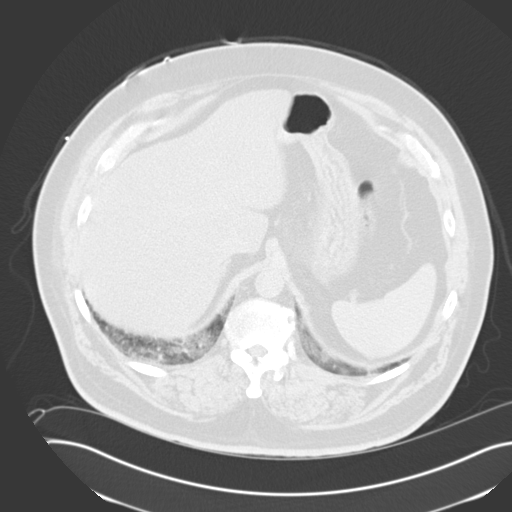
[im 17/73  lung]
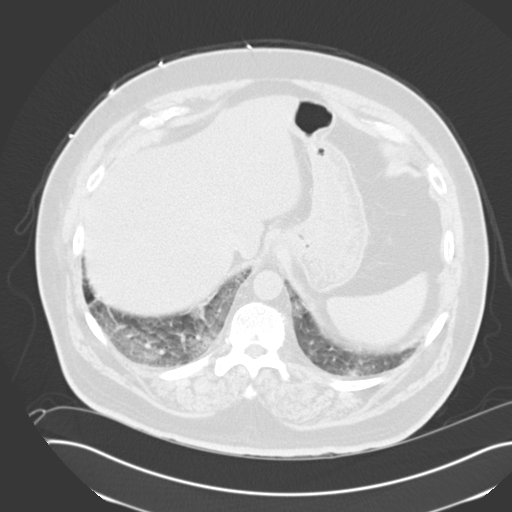
[im 22/73  mediastinal]
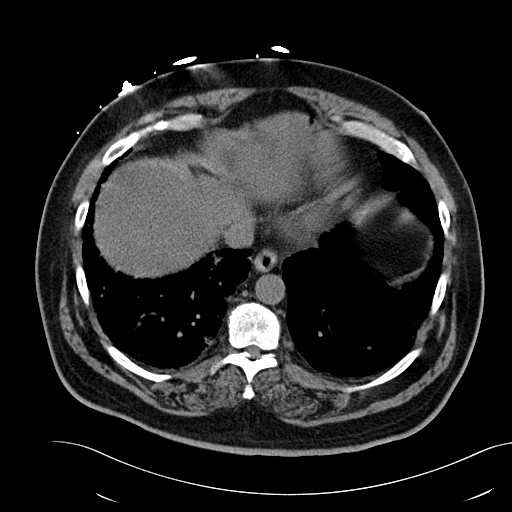
[im 22/73  lung]
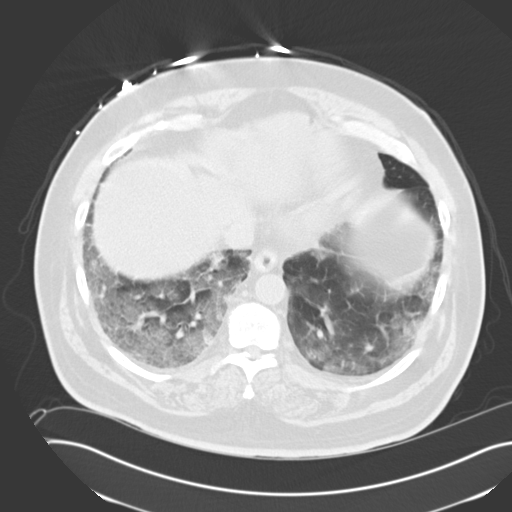
[im 27/73  lung]
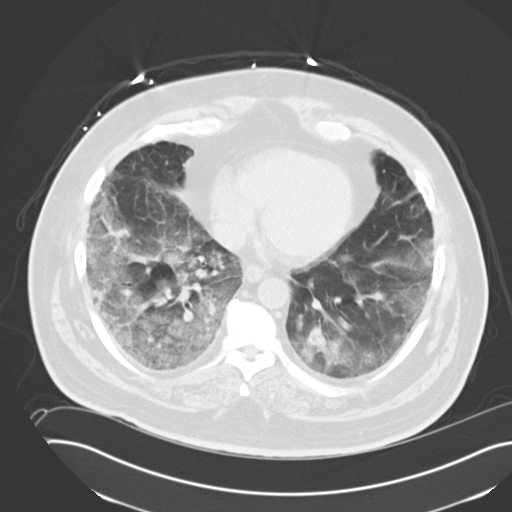
[im 30/73  lung]
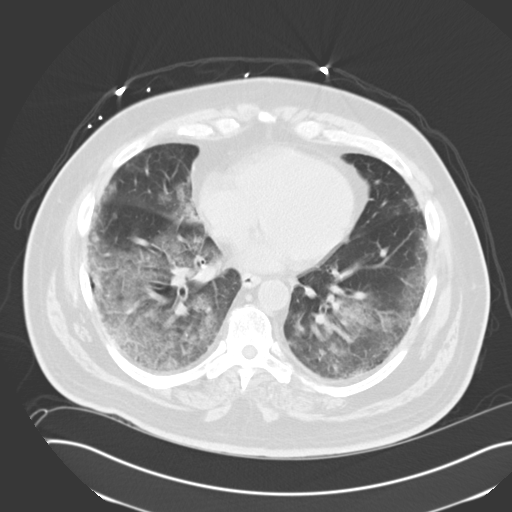
[im 35/73  lung]
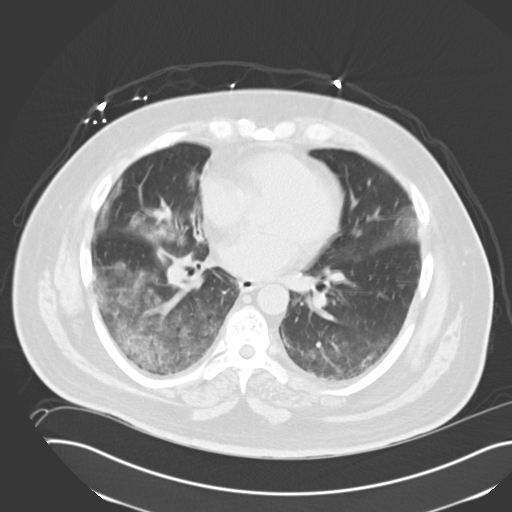
[im 39/73  mediastinal]
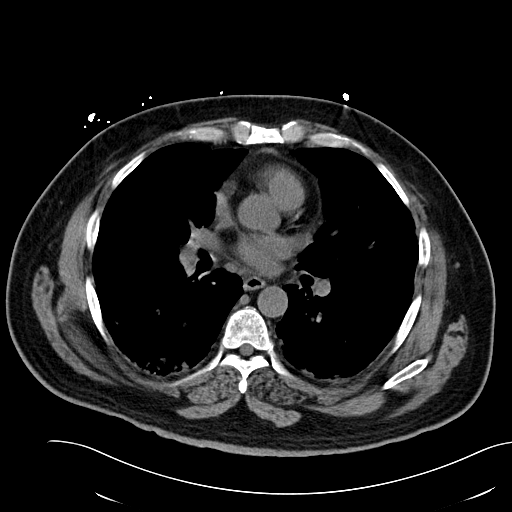
[im 39/73  lung]
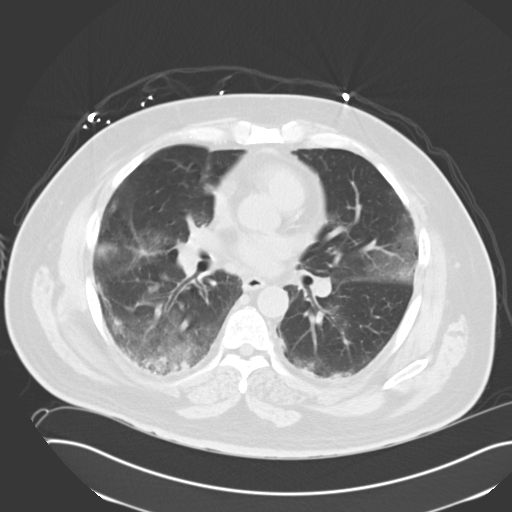
[im 43/73  lung]
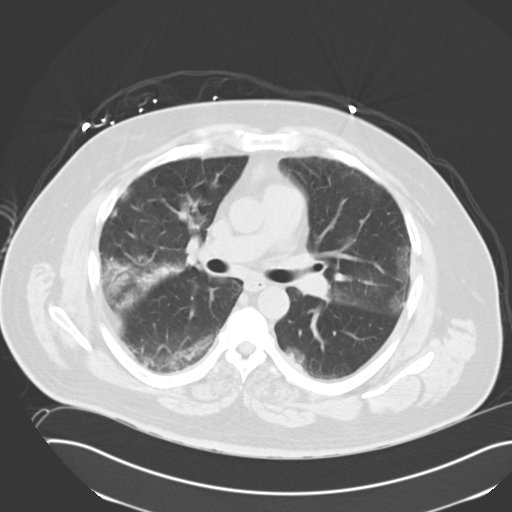
[im 46/73  lung]
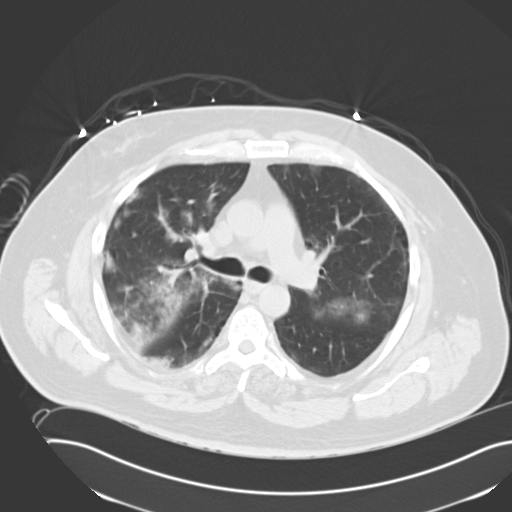
[im 51/73  lung]
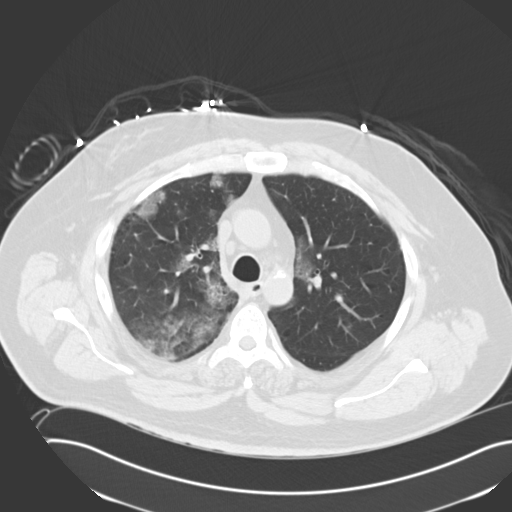
[im 57/73  mediastinal]
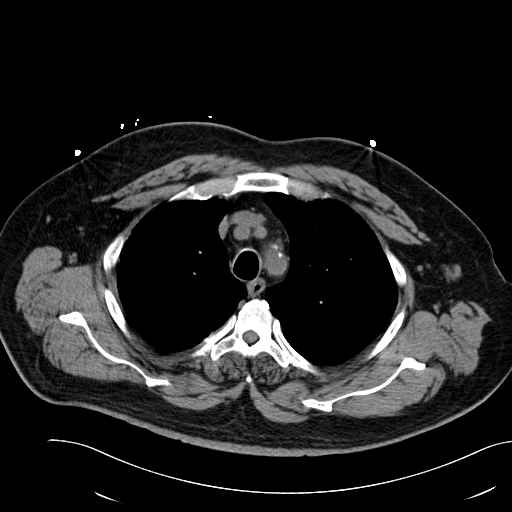
[im 57/73  lung]
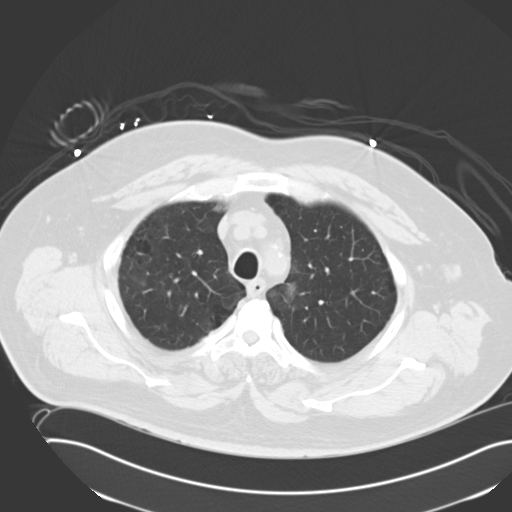
[im 59/73  lung]
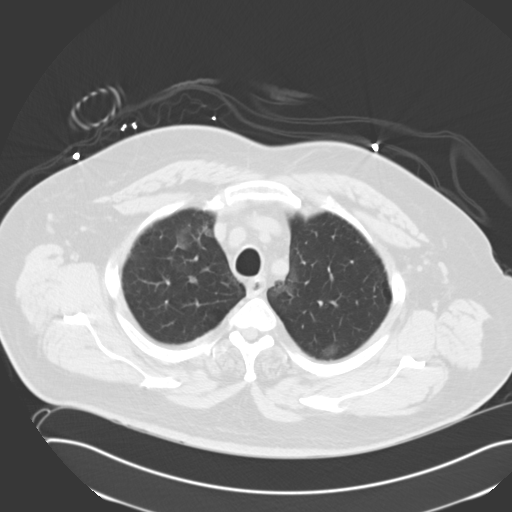
[im 65/73  lung]
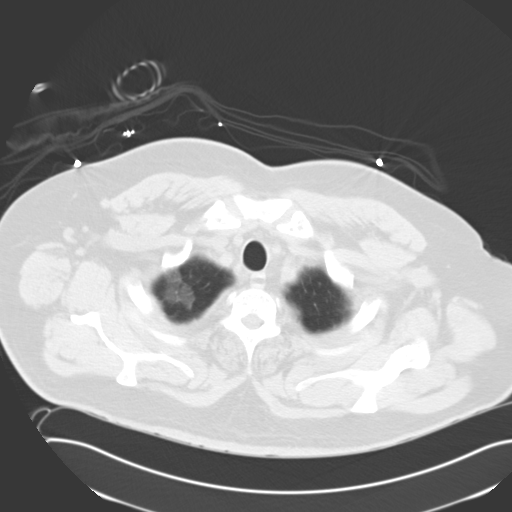
[im 70/73  lung]
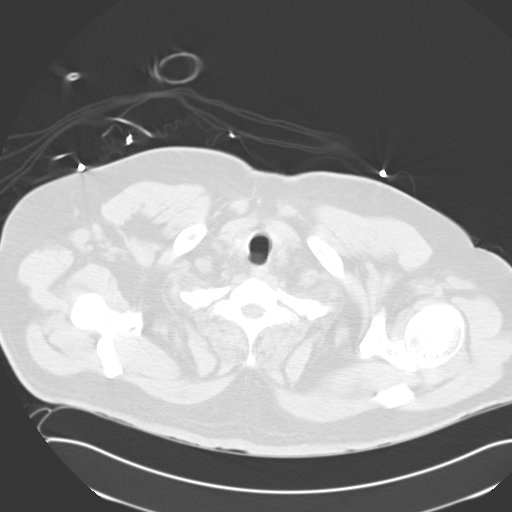

[16 of 33 positions shown; findings below may reference images not displayed]

FINDINGS: Mediastinum/Nodes: The heart is normal in size. No pericardial
effusion.

Atherosclerotic calcifications of the aortic arch.

Small mediastinal lymph nodes measuring up to 9 mm short axis.

Lungs/Pleura: Multifocal patchy/ ground-glass opacities throughout
all lobes, bilateral lower lobe and right upper lobe predominant,
suspicious for multifocal pneumonia.

Underlying mild paraseptal emphysematous changes in the bilateral
upper lobes.

No suspicious pulmonary nodules.

No pleural effusion or pneumothorax.

Upper abdomen: Visualized upper abdomen is notable for moderate
hepatic steatosis.

Musculoskeletal: Degenerative changes of the visualized
thoracolumbar spine.
IMPRESSION: Multifocal pneumonia, bilateral lower lobe predominant.

## 2017-04-14 IMAGING — CR DG CHEST 1V
1 series · 1 of 1 positions shown · non-contrast
Comparison: PA and lateral chest x-ray April 13, 2015

CLINICAL DATA: CHF, community-acquired pneumonia, sepsis.

EXAM:
CHEST 1 VIEW

[portable]
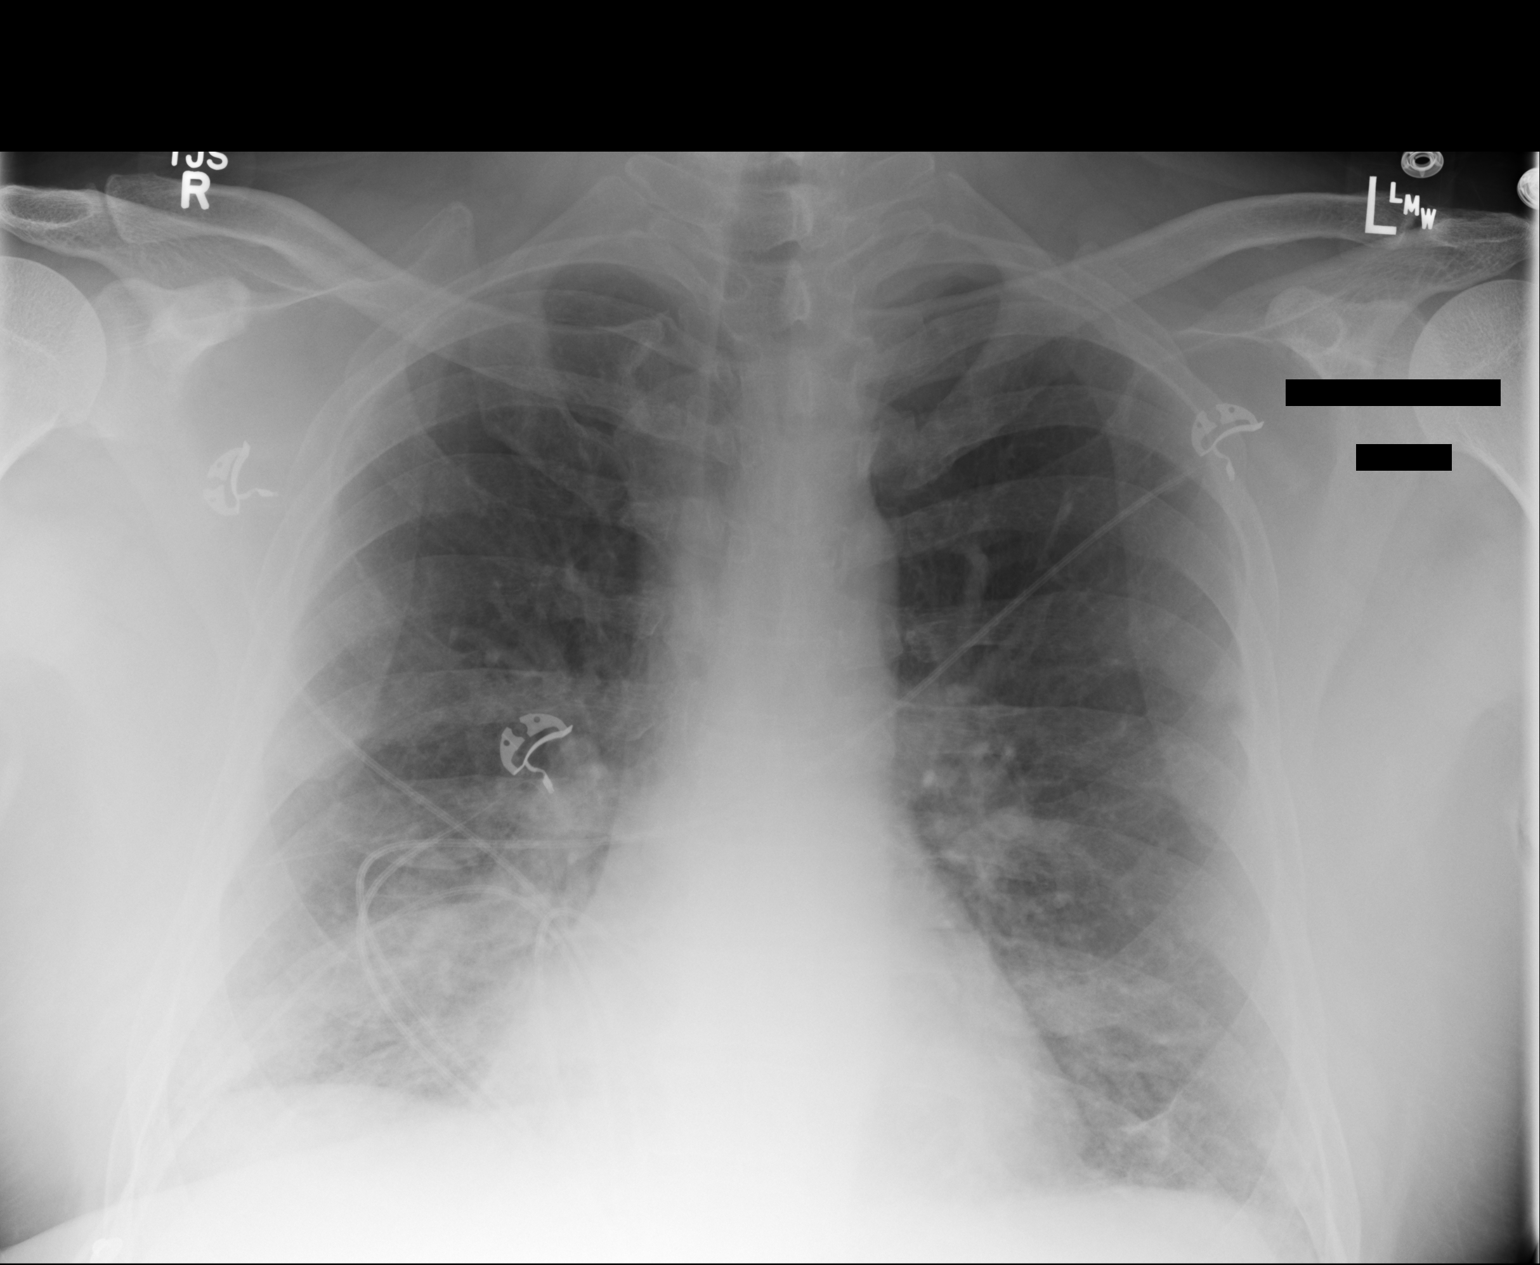

[1 of 1 positions shown; findings below may reference images not displayed]

FINDINGS: The lungs remain hyperinflated. The interstitial markings remain
increased and are slightly more conspicuous at both lung bases.
There is no pleural effusion or pneumothorax. The heart is normal in
size. The pulmonary vascularity is not engorged. The trachea is
midline. The observed bony thorax exhibits no acute abnormality.
IMPRESSION: Worsening of interstitial densities in the mid and lower lungs most
compatible with worsening of pneumonia superimposed on COPD. There
is stable mild central pulmonary vascular congestion without
cardiomegaly.
